# Patient Record
Sex: Female | Born: 1955 | Race: Black or African American | Hispanic: No | Smoking: Never smoker
Health system: Southern US, Community
[De-identification: ages and names within clinical notes are randomized; demographics above are authoritative.]

## PROBLEM LIST (undated history)

## (undated) DIAGNOSIS — F419 Anxiety disorder, unspecified: Secondary | ICD-10-CM

## (undated) DIAGNOSIS — E7800 Pure hypercholesterolemia, unspecified: Secondary | ICD-10-CM

## (undated) DIAGNOSIS — I10 Essential (primary) hypertension: Secondary | ICD-10-CM

## (undated) DIAGNOSIS — K219 Gastro-esophageal reflux disease without esophagitis: Secondary | ICD-10-CM

## (undated) HISTORY — DX: Anxiety disorder, unspecified: F41.9

## (undated) HISTORY — DX: Pure hypercholesterolemia, unspecified: E78.00

## (undated) HISTORY — DX: Gastro-esophageal reflux disease without esophagitis: K21.9

## (undated) HISTORY — DX: Essential (primary) hypertension: I10

---

## 2007-04-09 DIAGNOSIS — K589 Irritable bowel syndrome without diarrhea: Secondary | ICD-10-CM | POA: Insufficient documentation

## 2008-11-18 DIAGNOSIS — F411 Generalized anxiety disorder: Secondary | ICD-10-CM | POA: Insufficient documentation

## 2014-10-16 DIAGNOSIS — M25512 Pain in left shoulder: Secondary | ICD-10-CM | POA: Insufficient documentation

## 2015-12-22 LAB — HM COLONOSCOPY

## 2015-12-23 DIAGNOSIS — Z86010 Personal history of colonic polyps: Secondary | ICD-10-CM | POA: Insufficient documentation

## 2017-06-15 ENCOUNTER — Encounter: Payer: Self-pay | Admitting: Osteopathic Medicine

## 2017-06-15 ENCOUNTER — Ambulatory Visit (INDEPENDENT_AMBULATORY_CARE_PROVIDER_SITE_OTHER): Payer: BC Managed Care – PPO | Admitting: Osteopathic Medicine

## 2017-06-15 VITALS — BP 125/75 | HR 69 | Temp 98.5°F | Ht 65.0 in | Wt 172.8 lb

## 2017-06-15 DIAGNOSIS — K219 Gastro-esophageal reflux disease without esophagitis: Secondary | ICD-10-CM

## 2017-06-15 DIAGNOSIS — A6000 Herpesviral infection of urogenital system, unspecified: Secondary | ICD-10-CM | POA: Insufficient documentation

## 2017-06-15 DIAGNOSIS — Z114 Encounter for screening for human immunodeficiency virus [HIV]: Secondary | ICD-10-CM

## 2017-06-15 DIAGNOSIS — R350 Frequency of micturition: Secondary | ICD-10-CM

## 2017-06-15 DIAGNOSIS — E782 Mixed hyperlipidemia: Secondary | ICD-10-CM | POA: Diagnosis not present

## 2017-06-15 DIAGNOSIS — R7301 Impaired fasting glucose: Secondary | ICD-10-CM

## 2017-06-15 DIAGNOSIS — I10 Essential (primary) hypertension: Secondary | ICD-10-CM | POA: Diagnosis not present

## 2017-06-15 DIAGNOSIS — Z1159 Encounter for screening for other viral diseases: Secondary | ICD-10-CM | POA: Diagnosis not present

## 2017-06-15 DIAGNOSIS — F329 Major depressive disorder, single episode, unspecified: Secondary | ICD-10-CM | POA: Diagnosis not present

## 2017-06-15 DIAGNOSIS — F32A Depression, unspecified: Secondary | ICD-10-CM | POA: Insufficient documentation

## 2017-06-15 DIAGNOSIS — F419 Anxiety disorder, unspecified: Secondary | ICD-10-CM

## 2017-06-15 NOTE — Patient Instructions (Addendum)
   Thanks for coming to see us today  Labs today  Any concerns with anxiety, please come see me  Will request Pap/Mammogram records   When due for refills, please allow some extra time since prescriber will be switching to me Anything else you need just let us know!

## 2017-06-15 NOTE — Progress Notes (Signed)
HPI: Kathy Gutierrez is a 62 y.o. female who  has a past medical history of High blood pressure and High cholesterol.  she presents to [facility] today, 06/15/17,  for chief complaint of: Establish Care  Urinary Frequency See individual headings below   GERD: Prilosec about three times per week. Seeing GI - Dr Gwenevere Abbot. No significant symptoms lately.  HTN: Well-controlled on Bisoprolol/HCT , no chest pain, pressure, shortness of breath. No home blood pressures to report.  HLD: Well-controlled on Atorvastatin. Lipids last checked about a year ago  Anx/Dep: Wellbutrin 300 mg daily. She reports some increased anxiety, mostly due to work issues. Does not feel the need for additional medications or change in medication at this time.  Urinary frequency: Ongoing for some time. No pain/burning. Last few days a bit of discomfort with urination, no severe dysuria.   CKD: Slightly elevated creatinine/reduced GFR on previous labs done about a year ago. Stable.    Past medical, surgical, social and family history reviewed:  Patient Active Problem List   Diagnosis Date Noted  . Essential hypertension 06/15/2017  . Gastroesophageal reflux disease 06/15/2017  . Mixed hyperlipidemia 06/15/2017  . Anxiety and depression 06/15/2017  . Urinary frequency 06/15/2017  . Genital herpes simplex 06/15/2017   History reviewed. No pertinent surgical history.  Social History   Tobacco Use  . Smoking status: Never Smoker  . Smokeless tobacco: Never Used  Substance Use Topics  . Alcohol use: Not Currently    Family History  Problem Relation Age of Onset  . Diabetes Mother   . High Cholesterol Mother   . High blood pressure Mother   . Aortic aneurysm Mother   . High blood pressure Father   . Anuerysm Sister   . Multiple sclerosis Sister      Current medication list and allergy/intolerance information reviewed:    Current Outpatient Medications   Medication Sig Dispense Refill  . atorvastatin (LIPITOR) 20 MG tablet TK 1 T PO QD  3  . bisoprolol-hydrochlorothiazide (ZIAC) 5-6.25 MG tablet TK 1 T PO D  1  . buPROPion (WELLBUTRIN XL) 300 MG 24 hr tablet TK 1 T PO QAM  3  . Multiple Vitamins-Calcium (ONE-A-DAY WOMENS PO) Take by mouth.    Marland Kitchen omeprazole (PRILOSEC) 40 MG capsule Take 40 mg by mouth daily.    . valACYclovir (VALTREX) 500 MG tablet TK 1 T PO PRN  5   No current facility-administered medications for this visit.    No Known Allergies    Review of Systems:  Constitutional:  No  fever, no chills, No recent illness, No unintentional weight changes. No significant fatigue.   HEENT: No  headache, no vision change, no hearing change, No sore throat, No  sinus pressure  Cardiac: No  chest pain, No  pressure, No palpitations, No  Orthopnea  Respiratory:  No  shortness of breath. No  Cough  Gastrointestinal: No  abdominal pain, No  nausea, No  vomiting,  No  blood in stool, No  diarrhea, No  constipation   Musculoskeletal: No new myalgia/arthralgia  Skin: No  Rash, No other wounds/concerning lesions  Genitourinary: No  incontinence, No  abnormal genital bleeding, No abnormal genital discharge, +urinary frequency as per HPI  Hem/Onc: No  easy bruising/bleeding, No  abnormal lymph node  Endocrine: No cold intolerance,  No heat intolerance. No polyuria/polydipsia/polyphagia   Neurologic: No  weakness, No  dizziness, No  slurred speech/focal weakness/facial droop  Psychiatric: No  concerns with depression, No  concerns with anxiety, No sleep problems, No mood problems  Exam:  BP 125/75 (BP Location: Left Arm, Patient Position: Sitting, Cuff Size: Normal)   Pulse 69   Temp 98.5 F (36.9 C) (Oral)   Ht 5\' 5"  (1.651 m)   Wt 172 lb 12.8 oz (78.4 kg)   BMI 28.76 kg/m   Constitutional: VS see above. General Appearance: alert, well-developed, well-nourished, NAD  Eyes: Normal lids and conjunctive, non-icteric  sclera  Ears, Nose, Mouth, Throat: MMM, Normal external inspection ears/nares/mouth/lips/gums. TM normal bilaterally. Pharynx/tonsils no erythema, no exudate. Nasal mucosa normal.   Neck: No masses, trachea midline. No thyroid enlargement. No tenderness/mass appreciated. No lymphadenopathy  Respiratory: Normal respiratory effort. no wheeze, no rhonchi, no rales  Cardiovascular: S1/S2 normal, no murmur, no rub/gallop auscultated. RRR. No lower extremity edema.   Gastrointestinal: Nontender, no masses. No hepatomegaly, no splenomegaly. No hernia appreciated. Bowel sounds normal. Rectal exam deferred.   Musculoskeletal: Gait normal. No clubbing/cyanosis of digits.   Neurological: Normal balance/coordination. No tremor. No cranial nerve deficit on limited exam. Motor and sensation intact and symmetric. Cerebellar reflexes intact.   Skin: warm, dry, intact. No rash/ulcer. No concerning nevi or subq nodules on limited exam.    Psychiatric: Normal judgment/insight. Normal mood and affect. Oriented x3.      ASSESSMENT/PLAN:   Essential hypertension - Plan: CBC, COMPLETE METABOLIC PANEL WITH GFR, Lipid panel  Gastroesophageal reflux disease, esophagitis presence not specified  Mixed hyperlipidemia - Plan: Lipid panel  Anxiety and depression  Urinary frequency - Plan: Urinalysis, Routine w reflex microscopic  Need for hepatitis C screening test - Plan: Hepatitis C antibody  Encounter for screening for HIV - Plan: HIV antibody  Genital herpes simplex, unspecified site    Patient Instructions   Thanks for coming to see us today  Labs today  Any concerns with anxiety, please come see me  Will request Pap/Mammogram records   When due for refills, please allow some extra time since prescriber will be switching to me Anything else you need just let us know!     Visit summary with medication list and pertinent instructions was printed for patient to review. All questions at  time of visit were answered - patient instructed to contact office with any additional concerns. ER/RTC precautions were reviewed with the patient.   Follow-up plan: Return in about 6 months (around 12/15/2017) for monitor BP, can do PAP/Annual at that visit. See us sooner if needed .    Please note: voice recognition software was used to produce this document, and typos may escape review. Please contact Dr. Lyn HollingsheadAlexander for any needed clarifications.

## 2017-06-16 LAB — COMPLETE METABOLIC PANEL WITH GFR
AG RATIO: 1.6 (calc) (ref 1.0–2.5)
ALT: 13 U/L (ref 6–29)
AST: 17 U/L (ref 10–35)
Albumin: 4.5 g/dL (ref 3.6–5.1)
Alkaline phosphatase (APISO): 79 U/L (ref 33–130)
BILIRUBIN TOTAL: 0.4 mg/dL (ref 0.2–1.2)
BUN / CREAT RATIO: 10 (calc) (ref 6–22)
BUN: 12 mg/dL (ref 7–25)
CHLORIDE: 103 mmol/L (ref 98–110)
CO2: 31 mmol/L (ref 20–32)
Calcium: 9.9 mg/dL (ref 8.6–10.4)
Creat: 1.18 mg/dL — ABNORMAL HIGH (ref 0.50–0.99)
GFR, Est African American: 58 mL/min/{1.73_m2} — ABNORMAL LOW (ref >=60)
GFR, Est Non African American: 50 mL/min/{1.73_m2} — ABNORMAL LOW (ref >=60)
GLOBULIN: 2.9 g/dL (ref 1.9–3.7)
Glucose, Bld: 112 mg/dL — ABNORMAL HIGH (ref 65–99)
POTASSIUM: 3.7 mmol/L (ref 3.5–5.3)
Sodium: 141 mmol/L (ref 135–146)
TOTAL PROTEIN: 7.4 g/dL (ref 6.1–8.1)

## 2017-06-16 LAB — URINALYSIS, ROUTINE W REFLEX MICROSCOPIC
BACTERIA UA: NONE SEEN /HPF
BILIRUBIN URINE: NEGATIVE
Glucose, UA: NEGATIVE
Hgb urine dipstick: NEGATIVE
Hyaline Cast: NONE SEEN /LPF
Ketones, ur: NEGATIVE
NITRITE: NEGATIVE
PROTEIN: NEGATIVE
RBC / HPF: NONE SEEN /HPF (ref 0–2)
SQUAMOUS EPITHELIAL / LPF: NONE SEEN /HPF (ref <=5)
Specific Gravity, Urine: 1.011 (ref 1.001–1.03)
pH: 7.5 (ref 5.0–8.0)

## 2017-06-16 LAB — CBC
HEMATOCRIT: 37 % (ref 35.0–45.0)
HEMOGLOBIN: 12.6 g/dL (ref 11.7–15.5)
MCH: 30.4 pg (ref 27.0–33.0)
MCHC: 34.1 g/dL (ref 32.0–36.0)
MCV: 89.2 fL (ref 80.0–100.0)
MPV: 10.7 fL (ref 7.5–12.5)
Platelets: 285 10*3/uL (ref 140–400)
RBC: 4.15 10*6/uL (ref 3.80–5.10)
RDW: 13.5 % (ref 11.0–15.0)
WBC: 6.1 10*3/uL (ref 3.8–10.8)

## 2017-06-16 LAB — HEPATITIS C ANTIBODY
HEP C AB: NONREACTIVE
SIGNAL TO CUT-OFF: 0.01 (ref <1.00)

## 2017-06-16 LAB — LIPID PANEL
Cholesterol: 180 mg/dL (ref <200)
HDL: 45 mg/dL — ABNORMAL LOW (ref >50)
LDL CHOLESTEROL (CALC): 105 mg/dL — AB
Non-HDL Cholesterol (Calc): 135 mg/dL (calc) — ABNORMAL HIGH (ref <130)
Total CHOL/HDL Ratio: 4 (calc) (ref <5.0)
Triglycerides: 183 mg/dL — ABNORMAL HIGH (ref <150)

## 2017-06-16 LAB — HIV ANTIBODY (ROUTINE TESTING W REFLEX): HIV: NONREACTIVE

## 2017-06-20 ENCOUNTER — Encounter: Payer: Self-pay | Admitting: Osteopathic Medicine

## 2017-06-20 NOTE — Addendum Note (Signed)
Addended by: Deirdre PippinsALEXANDER, Amarra Sawyer M on: 06/20/2017 02:45 PM   Modules accepted: Orders

## 2017-06-23 LAB — HEMOGLOBIN A1C
EAG (MMOL/L): 7.1 (calc)
HEMOGLOBIN A1C: 6.1 %{Hb} — AB (ref <5.7)
Mean Plasma Glucose: 128 (calc)

## 2017-08-01 ENCOUNTER — Other Ambulatory Visit: Payer: Self-pay | Admitting: Osteopathic Medicine

## 2017-08-01 DIAGNOSIS — I10 Essential (primary) hypertension: Secondary | ICD-10-CM

## 2017-08-02 ENCOUNTER — Encounter: Payer: Self-pay | Admitting: Osteopathic Medicine

## 2017-08-02 NOTE — Telephone Encounter (Signed)
[facility] pharmacy requesting med RF for bisoprolol-hctz. Rx written by historical provider.

## 2017-08-02 NOTE — Telephone Encounter (Signed)
Left a detailed vm msg for pt. Call back information provided.

## 2017-09-26 ENCOUNTER — Other Ambulatory Visit: Payer: Self-pay | Admitting: Osteopathic Medicine

## 2017-09-26 DIAGNOSIS — I10 Essential (primary) hypertension: Secondary | ICD-10-CM

## 2017-11-07 ENCOUNTER — Ambulatory Visit (INDEPENDENT_AMBULATORY_CARE_PROVIDER_SITE_OTHER): Payer: BC Managed Care – PPO | Admitting: Sports Medicine

## 2017-11-07 ENCOUNTER — Ambulatory Visit (INDEPENDENT_AMBULATORY_CARE_PROVIDER_SITE_OTHER): Payer: BC Managed Care – PPO

## 2017-11-07 ENCOUNTER — Encounter: Payer: Self-pay | Admitting: Sports Medicine

## 2017-11-07 DIAGNOSIS — J069 Acute upper respiratory infection, unspecified: Secondary | ICD-10-CM

## 2017-11-07 DIAGNOSIS — R05 Cough: Secondary | ICD-10-CM | POA: Diagnosis not present

## 2017-11-07 DIAGNOSIS — R059 Cough, unspecified: Secondary | ICD-10-CM | POA: Insufficient documentation

## 2017-11-07 MED ORDER — BENZONATATE 200 MG PO CAPS: 200.0000 mg | ORAL_CAPSULE | Freq: Three times a day (TID) | ORAL | 0 refills | Status: DC | PRN

## 2017-11-07 MED ORDER — FLUTICASONE PROPIONATE 50 MCG/ACT NA SUSP: NASAL | 3 refills | Status: DC

## 2017-11-07 MED ORDER — AZITHROMYCIN 250 MG PO TABS: ORAL_TABLET | ORAL | 0 refills | Status: DC

## 2017-11-07 NOTE — Progress Notes (Signed)
Subjective:    CC: Coughing  HPI: This is a pleasant 62 year old female, for the past couple of days she has had runny nose, sore throat with a mild cough.  No fevers or chills, no GI symptoms, no skin rash.  Symptoms are mild, persistent.  No overt facial pain or pressure.  I reviewed the past medical history, family history, social history, surgical history, and allergies today and no changes were needed.  Please see the problem list section below in epic for further details.  Past Medical History: Past Medical History:  Diagnosis Date  . High blood pressure   . High cholesterol    Past Surgical History: No past surgical history on file. Social History: Social History   Socioeconomic History  . Marital status: Divorced    Spouse name: Not on file  . Number of children: 1  . Years of education: Not on file  . Highest education level: Master's degree (e.g., MA, MS, MEng, MEd, MSW, MBA)  Occupational History  . Occupation: Garment/textile technologist: [employer]  Social Needs  . Financial resource strain: Not on file  . Food insecurity:    Worry: Not on file    Inability: Not on file  . Transportation needs:    Medical: Not on file    Non-medical: Not on file  Tobacco Use  . Smoking status: Never Smoker  . Smokeless tobacco: Never Used  Substance and Sexual Activity  . Alcohol use: Not Currently  . Drug use: Never  . Sexual activity: Yes    Birth control/protection: None  Lifestyle  . Physical activity:    Days per week: Not on file    Minutes per session: Not on file  . Stress: Not on file  Relationships  . Social connections:    Talks on phone: Not on file    Gets together: Not on file    Attends religious service: Not on file    Active member of club or organization: Not on file    Attends meetings of clubs or organizations: Not on file    Relationship status: Not on file  Other Topics Concern  . Not on file  Social History Narrative  . Not on file    Family History: Family History  Problem Relation Age of Onset  . Diabetes Mother   . High Cholesterol Mother   . High blood pressure Mother   . Aortic aneurysm Mother   . High blood pressure Father   . Anuerysm Sister   . Multiple sclerosis Sister    Allergies: No Known Allergies Medications: See med rec.  Review of Systems: No fevers, chills, night sweats, weight loss, chest pain, or shortness of breath.   Objective:    General: Well Developed, well nourished, and in no acute distress.  Neuro: Alert and oriented x3, extra-ocular muscles intact, sensation grossly intact.  HEENT: Normocephalic, atraumatic, pupils equal round reactive to light, neck supple, no masses, no lymphadenopathy, thyroid nonpalpable.  Oropharynx, nasopharynx, ear canals unremarkable. Skin: Warm and dry, no rashes. Cardiac: Regular rate and rhythm, no murmurs rubs or gallops, no lower extremity edema.  Respiratory: Coarse breath sounds in the right upper lobe. Not using accessory muscles, speaking in full sentences.  Chest x-ray reviewed, there is a vague nodular opacity in the right upper lobe.  Impression and Recommendations:    Coughing Some symptoms of lower respiratory tract involvement with right upper lobe coarse breath sounds, coughing. Principal symptom is rhinorrhea with postnasal drip.  Adding Flonase, benzonatate, chest x-ray. Return if no better in a week or 2.  Vague right upper lobe nodular opacity, combined with coarse breath sounds in the right upper lobe we are going to treat this as a pneumonia. Adding azithromycin, return to see [place] in a month for repeat chest x-ray.  ___________________________________________ Ihor Austin. Benjamin Stain, M.D., ABFM., CAQSM. Primary Care and Sports Medicine [facility]  Adjunct Instructor of Family Medicine  [employer]

## 2017-11-07 NOTE — Assessment & Plan Note (Addendum)
Some symptoms of lower respiratory tract involvement with right upper lobe coarse breath sounds, coughing. Principal symptom is rhinorrhea with postnasal drip. Adding Flonase, benzonatate, chest x-ray. Return if no better in a week or 2.  Vague right upper lobe nodular opacity, combined with coarse breath sounds in the right upper lobe we are going to treat this as a pneumonia. Adding azithromycin, return to see [place] in a month for repeat chest x-ray.

## 2017-11-14 ENCOUNTER — Ambulatory Visit: Payer: BC Managed Care – PPO | Admitting: Sports Medicine

## 2017-11-15 ENCOUNTER — Telehealth: Payer: Self-pay | Admitting: Sports Medicine

## 2017-11-15 DIAGNOSIS — R918 Other nonspecific abnormal finding of lung field: Secondary | ICD-10-CM

## 2017-11-15 DIAGNOSIS — R05 Cough: Secondary | ICD-10-CM

## 2017-11-15 DIAGNOSIS — R059 Cough, unspecified: Secondary | ICD-10-CM

## 2017-11-15 NOTE — Telephone Encounter (Signed)
PT requested chest xray before 11/24/17 appointment.  Please advise and contact.

## 2017-11-15 NOTE — Telephone Encounter (Signed)
LMOM notifying pt to get cxr the day before her appointment.

## 2017-11-22 ENCOUNTER — Ambulatory Visit (INDEPENDENT_AMBULATORY_CARE_PROVIDER_SITE_OTHER): Payer: BC Managed Care – PPO

## 2017-11-22 DIAGNOSIS — R918 Other nonspecific abnormal finding of lung field: Secondary | ICD-10-CM | POA: Diagnosis not present

## 2017-11-24 ENCOUNTER — Ambulatory Visit (INDEPENDENT_AMBULATORY_CARE_PROVIDER_SITE_OTHER): Payer: BC Managed Care – PPO | Admitting: Sports Medicine

## 2017-11-24 DIAGNOSIS — R059 Cough, unspecified: Secondary | ICD-10-CM

## 2017-11-24 DIAGNOSIS — R05 Cough: Secondary | ICD-10-CM

## 2017-11-24 NOTE — Progress Notes (Signed)
  Subjective:    CC: Follow-up  HPI: Kathy Gutierrez returns, she had a mild pneumonia, treated with azithromycin, symptoms resolved, repeat chest x-ray clear.  I reviewed the past medical history, family history, social history, surgical history, and allergies today and no changes were needed.  Please see the problem list section below in epic for further details.  Past Medical History: Past Medical History:  Diagnosis Date  . High blood pressure   . High cholesterol    Past Surgical History: No past surgical history on file. Social History: Social History   Socioeconomic History  . Marital status: Divorced    Spouse name: Not on file  . Number of children: 1  . Years of education: Not on file  . Highest education level: Master's degree (e.g., MA, MS, MEng, MEd, MSW, MBA)  Occupational History  . Occupation: Garment/textile technologist: [employer]  Social Needs  . Financial resource strain: Not on file  . Food insecurity:    Worry: Not on file    Inability: Not on file  . Transportation needs:    Medical: Not on file    Non-medical: Not on file  Tobacco Use  . Smoking status: Never Smoker  . Smokeless tobacco: Never Used  Substance and Sexual Activity  . Alcohol use: Not Currently  . Drug use: Never  . Sexual activity: Yes    Birth control/protection: None  Lifestyle  . Physical activity:    Days per week: Not on file    Minutes per session: Not on file  . Stress: Not on file  Relationships  . Social connections:    Talks on phone: Not on file    Gets together: Not on file    Attends religious service: Not on file    Active member of club or organization: Not on file    Attends meetings of clubs or organizations: Not on file    Relationship status: Not on file  Other Topics Concern  . Not on file  Social History Narrative  . Not on file   Family History: Family History  Problem Relation Age of Onset  . Diabetes Mother   . High Cholesterol Mother   . High blood  pressure Mother   . Aortic aneurysm Mother   . High blood pressure Father   . Anuerysm Sister   . Multiple sclerosis Sister    Allergies: No Known Allergies Medications: See med rec.  Review of Systems: No fevers, chills, night sweats, weight loss, chest pain, or shortness of breath.   Objective:    General: Well Developed, well nourished, and in no acute distress.  Neuro: Alert and oriented x3, extra-ocular muscles intact, sensation grossly intact.  HEENT: Normocephalic, atraumatic, pupils equal round reactive to light, neck supple, no masses, no lymphadenopathy, thyroid nonpalpable.  Skin: Warm and dry, no rashes. Cardiac: Regular rate and rhythm, no murmurs rubs or gallops, no lower extremity edema.  Respiratory: Clear to auscultation bilaterally. Not using accessory muscles, speaking in full sentences.  Impression and Recommendations:    Coughing Likely mild pneumonia. Repeat chest x-ray was clear although she did come back a little bit too early for it. Symptoms have resolved, return as needed. ___________________________________________ Ihor Austin. Benjamin Stain, M.D., ABFM., CAQSM. Primary Care and Sports Medicine [facility]  Adjunct Instructor of Family Medicine  [employer]

## 2017-11-24 NOTE — Assessment & Plan Note (Signed)
Likely mild pneumonia. Repeat chest x-ray was clear although she did come back a little bit too early for it. Symptoms have resolved, return as needed.

## 2017-12-14 ENCOUNTER — Ambulatory Visit: Payer: BC Managed Care – PPO | Admitting: Osteopathic Medicine

## 2018-01-02 ENCOUNTER — Ambulatory Visit: Payer: BC Managed Care – PPO | Admitting: Osteopathic Medicine

## 2018-01-02 ENCOUNTER — Encounter: Payer: Self-pay | Admitting: Osteopathic Medicine

## 2018-01-02 VITALS — BP 128/75 | HR 62 | Temp 98.3°F | Wt 170.7 lb

## 2018-01-02 DIAGNOSIS — E782 Mixed hyperlipidemia: Secondary | ICD-10-CM | POA: Diagnosis not present

## 2018-01-02 DIAGNOSIS — K219 Gastro-esophageal reflux disease without esophagitis: Secondary | ICD-10-CM | POA: Diagnosis not present

## 2018-01-02 DIAGNOSIS — I10 Essential (primary) hypertension: Secondary | ICD-10-CM

## 2018-01-02 DIAGNOSIS — R7301 Impaired fasting glucose: Secondary | ICD-10-CM | POA: Diagnosis not present

## 2018-01-02 LAB — POCT GLYCOSYLATED HEMOGLOBIN (HGB A1C): Hemoglobin A1C: 5.9 % — AB (ref 4.0–5.6)

## 2018-01-02 MED ORDER — ATORVASTATIN CALCIUM 20 MG PO TABS: 20.0000 mg | ORAL_TABLET | Freq: Every day | ORAL | 3 refills | Status: DC

## 2018-01-02 MED ORDER — OMEPRAZOLE 40 MG PO CPDR: 40.0000 mg | DELAYED_RELEASE_CAPSULE | Freq: Every day | ORAL | 3 refills | Status: DC

## 2018-01-02 NOTE — Patient Instructions (Signed)
Blood pressure:  We will leave medications as is for now  Top number goal 130 or less  Bottom number goal 80 or less  Keep a record of blood pressures over the next 2 weeks  Call or message [place] to let us know what the numbers are  Prediabetes:  Consider metformin medication if A1c goes up  Diet: Avoid carbohydrates/sugars, increase fiber  Exercise: Increase as tolerated

## 2018-01-02 NOTE — Progress Notes (Signed)
HPI: Kathy Gutierrez is a 62 y.o. female who  has a past medical history of High blood pressure and High cholesterol.  she presents to [facility] today, 01/02/18,  for chief complaint of:  Sugars follow-up BP follow-up  Prediabetic range sugars, overdue for follow-up.  A1C record: 06/23/17: 6.1% 01/02/18 today: 5.9%  BP recheck: elevated BP on intake to 144/72, no CP/SOB, no HA/VC, no dizziness. Recheck at 150/100. Taking bisoprolol-HCT 5-6.25.  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   BP Readings from Last 3 Encounters:  01/02/18 (!) 144/72  11/24/17 120/70  11/07/17 (!) 141/84      Past medical history, surgical history, and family history reviewed.  Current medication list and allergy/intolerance information reviewed.   (See remainder of HPI, ROS, Phys Exam below)  Results for orders placed or performed in visit on 01/02/18 (from the past 72 hour(s))  POCT HgB A1C     Status: Abnormal   Collection Time: 01/02/18  7:56 AM  Result Value Ref Range   Hemoglobin A1C 5.9 (A) 4.0 - 5.6 %   HbA1c POC (<> result, manual entry)     HbA1c, POC (prediabetic range)     HbA1c, POC (controlled diabetic range)          ASSESSMENT/PLAN:   Elevated fasting glucose - Plan: POCT HgB A1C  Essential hypertension  Gastroesophageal reflux disease, esophagitis presence not specified - Plan: omeprazole (PRILOSEC) 40 MG capsule  Mixed hyperlipidemia - Plan: atorvastatin (LIPITOR) 20 MG tablet   Meds ordered this encounter  Medications  . omeprazole (PRILOSEC) 40 MG capsule    Sig: Take 1 capsule (40 mg total) by mouth daily.    Dispense:  90 capsule    Refill:  3  . atorvastatin (LIPITOR) 20 MG tablet    Sig: Take 1 tablet (20 mg total) by mouth daily.    Dispense:  90 tablet    Refill:  3    Patient Instructions  Blood pressure:  We will leave  medications as is for now  Top number goal 130 or less  Bottom number goal 80 or less  Keep a record of blood pressures over the next 2 weeks  Call or message [place] to let us know what the numbers are  Prediabetes:  Consider metformin medication if A1c goes up  Diet: Avoid carbohydrates/sugars, increase fiber  Exercise: Increase as tolerated   Follow-up plan: Return in about 4 months (around 05/03/2018) for prediabetes - A1C recheck. Sooner if needed! .                 ############################################ ############################################ ############################################ ############################################    Outpatient Encounter Medications as of 01/02/2018  Medication Sig  . atorvastatin (LIPITOR) 20 MG tablet TK 1 T PO QD  . bisoprolol-hydrochlorothiazide (ZIAC) 5-6.25 MG tablet TAKE 1 TABLET BY MOUTH DAILY  . buPROPion (WELLBUTRIN XL) 300 MG 24 hr tablet TK 1 T PO QAM  . fluticasone (FLONASE) 50 MCG/ACT nasal spray One spray in each nostril twice a day, use left hand for right nostril, and right hand for left nostril.  . Multiple Vitamins-Calcium (ONE-A-DAY WOMENS PO) Take by mouth.  Marland Kitchen omeprazole (PRILOSEC) 40 MG capsule Take 40 mg by mouth daily.  . valACYclovir (VALTREX) 500 MG tablet TK 1 T PO PRN   No facility-administered encounter medications on file as of 01/02/2018.    No Known  Allergies    Review of Systems:  Constitutional: No recent illness  HEENT: No  headache, no vision change  Cardiac: No  chest pain, No  pressure, No palpitations  Respiratory:  No  shortness of breath. No  Cough  Gastrointestinal: No  abdominal pain, no change on bowel habits  Musculoskeletal: No new myalgia/arthralgia  Skin: No  Rash  Hem/Onc: No  easy bruising/bleeding, No  abnormal lumps/bumps  Neurologic: No  weakness, No  Dizziness  Psychiatric: No  concerns with depression, No  concerns with anxiety  Exam:  BP  (!) 144/72 (BP Location: Left Arm, Patient Position: Sitting, Cuff Size: Normal)   Pulse 67   Temp 98.3 F (36.8 C) (Oral)   Wt 170 lb 11.2 oz (77.4 kg)   BMI 28.41 kg/m   Constitutional: VS see above. General Appearance: alert, well-developed, well-nourished, NAD  Eyes: Normal lids and conjunctive, non-icteric sclera  Ears, Nose, Mouth, Throat: MMM, Normal external inspection ears/nares/mouth/lips/gums.  Neck: No masses, trachea midline.   Respiratory: Normal respiratory effort. no wheeze, no rhonchi, no rales  Cardiovascular: S1/S2 normal, no murmur, no rub/gallop auscultated. RRR.   Musculoskeletal: Gait normal. Symmetric and independent movement of all extremities  Neurological: Normal balance/coordination. No tremor.  Skin: warm, dry, intact.   Psychiatric: Normal judgment/insight. Normal mood and affect. Oriented x3.   Visit summary with medication list and pertinent instructions was printed for patient to review, advised to alert [place] if any changes needed. All questions at time of visit were answered - patient instructed to contact office with any additional concerns. ER/RTC precautions were reviewed with the patient and understanding verbalized.   Follow-up plan: Return in about 4 months (around 05/03/2018) for prediabetes - A1C recheck. Sooner if needed! .  Note: Total time spent 25 minutes, greater than 50% of the visit was spent face-to-face counseling and coordinating care for the following: The primary encounter diagnosis was Elevated fasting glucose. Diagnoses of Essential hypertension, Gastroesophageal reflux disease, esophagitis presence not specified, and Mixed hyperlipidemia were also pertinent to this visit.Marland Kitchen  Please note: voice recognition software was used to produce this document, and typos may escape review. Please contact Dr. Lyn Hollingshead for any needed clarifications.

## 2018-02-11 ENCOUNTER — Other Ambulatory Visit: Payer: Self-pay | Admitting: Osteopathic Medicine

## 2018-02-11 DIAGNOSIS — I10 Essential (primary) hypertension: Secondary | ICD-10-CM

## 2018-03-23 LAB — HM MAMMOGRAPHY

## 2018-04-19 ENCOUNTER — Encounter: Payer: Self-pay | Admitting: Osteopathic Medicine

## 2018-05-10 ENCOUNTER — Other Ambulatory Visit: Payer: Self-pay | Admitting: Osteopathic Medicine

## 2018-05-10 DIAGNOSIS — I10 Essential (primary) hypertension: Secondary | ICD-10-CM

## 2018-07-06 ENCOUNTER — Encounter: Payer: Self-pay | Admitting: Osteopathic Medicine

## 2018-07-06 DIAGNOSIS — A6000 Herpesviral infection of urogenital system, unspecified: Secondary | ICD-10-CM

## 2018-07-06 MED ORDER — VALACYCLOVIR HCL 500 MG PO TABS: 500.0000 mg | ORAL_TABLET | Freq: Every day | ORAL | 3 refills | Status: DC

## 2018-08-07 ENCOUNTER — Ambulatory Visit: Payer: BC Managed Care – PPO | Admitting: Osteopathic Medicine

## 2018-08-07 ENCOUNTER — Other Ambulatory Visit: Payer: Self-pay | Admitting: Osteopathic Medicine

## 2018-08-07 DIAGNOSIS — I10 Essential (primary) hypertension: Secondary | ICD-10-CM

## 2018-08-07 NOTE — Telephone Encounter (Signed)
Patient has appt tomorrow, to address refill at that time.

## 2018-08-07 NOTE — Telephone Encounter (Signed)
Please advise 

## 2018-08-08 ENCOUNTER — Encounter: Payer: Self-pay | Admitting: Osteopathic Medicine

## 2018-08-08 ENCOUNTER — Ambulatory Visit (INDEPENDENT_AMBULATORY_CARE_PROVIDER_SITE_OTHER): Payer: BC Managed Care – PPO | Admitting: Osteopathic Medicine

## 2018-08-08 ENCOUNTER — Other Ambulatory Visit: Payer: Self-pay | Admitting: Osteopathic Medicine

## 2018-08-08 VITALS — BP 135/79 | HR 57 | Temp 97.7°F | Wt 167.3 lb

## 2018-08-08 DIAGNOSIS — R7303 Prediabetes: Secondary | ICD-10-CM

## 2018-08-08 DIAGNOSIS — I10 Essential (primary) hypertension: Secondary | ICD-10-CM

## 2018-08-08 DIAGNOSIS — R5382 Chronic fatigue, unspecified: Secondary | ICD-10-CM

## 2018-08-08 LAB — POCT GLYCOSYLATED HEMOGLOBIN (HGB A1C): Hemoglobin A1C: 5.9 % — AB (ref 4.0–5.6)

## 2018-08-08 MED ORDER — BISOPROLOL-HYDROCHLOROTHIAZIDE 5-6.25 MG PO TABS: 1.0000 | ORAL_TABLET | Freq: Every day | ORAL | 1 refills | Status: DC

## 2018-08-08 MED ORDER — METFORMIN HCL ER 750 MG PO TB24: 750.0000 mg | ORAL_TABLET | Freq: Every day | ORAL | 1 refills | Status: DC

## 2018-08-08 NOTE — Progress Notes (Signed)
HPI: Kathy Gutierrez is a 63 y.o. female who  has a past medical history of High blood pressure and High cholesterol.  [facility], 08/08/18,  for chief complaint of: Prediabetes follow-up   A1C Prediabetic range sugars, overdue for follow-up.  A1C record: 06/23/17: 6.1% 01/02/18: 5.9% Today 08/08/18: 5.9% Pt open to starting metformin     BP Readings from Last 3 Encounters:  08/08/18 135/79  01/02/18 128/75  11/24/17 120/70       Past medical, surgical, social and family history reviewed:  Patient Active Problem List   Diagnosis Date Noted  . Coughing 11/07/2017  . Essential hypertension 06/15/2017  . Gastroesophageal reflux disease 06/15/2017  . Mixed hyperlipidemia 06/15/2017  . Anxiety and depression 06/15/2017  . Urinary frequency 06/15/2017  . Genital herpes simplex 06/15/2017    No past surgical history on file.  Social History   Tobacco Use  . Smoking status: Never Smoker  . Smokeless tobacco: Never Used  Substance Use Topics  . Alcohol use: Not Currently    Family History  Problem Relation Age of Onset  . Diabetes Mother   . High Cholesterol Mother   . High blood pressure Mother   . Aortic aneurysm Mother   . High blood pressure Father   . Anuerysm Sister   . Multiple sclerosis Sister      Current medication list and allergy/intolerance information reviewed:    Current Outpatient Medications  Medication Sig Dispense Refill  . atorvastatin (LIPITOR) 20 MG tablet Take 1 tablet (20 mg total) by mouth daily. 90 tablet 3  . bisoprolol-hydrochlorothiazide (ZIAC) 5-6.25 MG tablet TAKE 1 TABLET BY MOUTH DAILY 90 tablet 0  . buPROPion (WELLBUTRIN XL) 300 MG 24 hr tablet TK 1 T PO QAM  3  . fluticasone (FLONASE) 50 MCG/ACT nasal spray One spray in each nostril twice a day, use left hand for right nostril, and right hand for left nostril. 48 g 3  . Multiple Vitamins-Calcium (ONE-A-DAY WOMENS PO)  Take by mouth.    Marland Kitchen. omeprazole (PRILOSEC) 40 MG capsule Take 1 capsule (40 mg total) by mouth daily. 90 capsule 3  . valACYclovir (VALTREX) 500 MG tablet Take 1 tablet (500 mg total) by mouth daily. 90 tablet 3  . metFORMIN (GLUCOPHAGE XR) 750 MG 24 hr tablet Take 1 tablet (750 mg total) by mouth daily with breakfast. 90 tablet 1   No current facility-administered medications for this visit.     No Known Allergies    Review of Systems:  Constitutional:  No  fever, no chills, No recent illness, +fatigue   Cardiac: No  chest pain  Respiratory:  No  shortness of breath.   Neurologic: No  weakness, No  dizziness  Psychiatric: No  concerns with depression, No  concerns with anxiety  Exam:  BP 135/79 (BP Location: Left Arm, Patient Position: Sitting, Cuff Size: Normal)   Pulse (!) 57   Temp 97.7 F (36.5 C) (Oral)   Wt 167 lb 4.8 oz (75.9 kg)   BMI 27.84 kg/m   Constitutional: VS see above. General Appearance: alert, well-developed, well-nourished, NAD  Eyes: Normal lids and conjunctive, non-icteric sclera  Neck: No masses, trachea midline.   Respiratory: Normal respiratory effort.  Musculoskeletal: Gait normal.    Neurological: Normal balance/coordination. No tremor.   Skin: warm, dry, intact.  Psychiatric: Normal judgment/insight. Normal mood and affect. Oriented x3.    ASSESSMENT/PLAN: The primary encounter diagnosis was Prediabetes. A diagnosis  of Chronic fatigue was also pertinent to this visit.   Trial metformin for prevention of progression of disease, follow-up A1C    Orders Placed This Encounter  Procedures  . CBC  . COMPLETE METABOLIC PANEL WITH GFR  . Lipid panel  . TSH  . VITAMIN D 25 Hydroxy (Vit-D Deficiency, Fractures)    Meds ordered this encounter  Medications  . metFORMIN (GLUCOPHAGE XR) 750 MG 24 hr tablet    Sig: Take 1 tablet (750 mg total) by mouth daily with breakfast.    Dispense:  90 tablet    Refill:  1         Visit  summary with medication list and pertinent instructions was printed for patient to review. All questions at time of visit were answered - patient instructed to contact office with any additional concerns or updates. ER/RTC precautions were reviewed with the patient.   Note: Total time spent 25 minutes, greater than 50% of the visit was spent face-to-face counseling and coordinating care for the above diagnoses listed in assessment/plan.   Please note: voice recognition software was used to produce this document, and typos may escape review. Please contact Dr. Sheppard Coil for any needed clarifications.     Follow-up plan: Return in about 3 months (around 11/08/2018) for recheck A1C / annual physical - see me sooner if needed! Marland Kitchen

## 2018-08-08 NOTE — Addendum Note (Signed)
Addended by: Mertha Finders on: 08/08/2018 09:38 AM   Modules accepted: Orders

## 2018-08-09 LAB — COMPLETE METABOLIC PANEL WITH GFR
AG Ratio: 1.6 (calc) (ref 1.0–2.5)
ALT: 11 U/L (ref 6–29)
AST: 14 U/L (ref 10–35)
Albumin: 4.3 g/dL (ref 3.6–5.1)
Alkaline phosphatase (APISO): 73 U/L (ref 37–153)
BUN/Creatinine Ratio: 9 (calc) (ref 6–22)
BUN: 11 mg/dL (ref 7–25)
CO2: 32 mmol/L (ref 20–32)
Calcium: 9.9 mg/dL (ref 8.6–10.4)
Chloride: 106 mmol/L (ref 98–110)
Creat: 1.18 mg/dL — ABNORMAL HIGH (ref 0.50–0.99)
GFR, Est African American: 58 mL/min/{1.73_m2} — ABNORMAL LOW (ref >=60)
GFR, Est Non African American: 50 mL/min/{1.73_m2} — ABNORMAL LOW (ref >=60)
Globulin: 2.7 g/dL (calc) (ref 1.9–3.7)
Glucose, Bld: 120 mg/dL — ABNORMAL HIGH (ref 65–99)
Potassium: 4.4 mmol/L (ref 3.5–5.3)
Sodium: 144 mmol/L (ref 135–146)
Total Bilirubin: 0.4 mg/dL (ref 0.2–1.2)
Total Protein: 7 g/dL (ref 6.1–8.1)

## 2018-08-09 LAB — LIPID PANEL
Cholesterol: 178 mg/dL (ref <200)
HDL: 46 mg/dL — ABNORMAL LOW (ref >=50)
LDL Cholesterol (Calc): 105 mg/dL (calc) — ABNORMAL HIGH
Non-HDL Cholesterol (Calc): 132 mg/dL (calc) — ABNORMAL HIGH (ref <130)
Total CHOL/HDL Ratio: 3.9 (calc) (ref <5.0)
Triglycerides: 154 mg/dL — ABNORMAL HIGH (ref <150)

## 2018-08-09 LAB — CBC
HCT: 35.7 % (ref 35.0–45.0)
Hemoglobin: 11.9 g/dL (ref 11.7–15.5)
MCH: 30.6 pg (ref 27.0–33.0)
MCHC: 33.3 g/dL (ref 32.0–36.0)
MCV: 91.8 fL (ref 80.0–100.0)
MPV: 11.1 fL (ref 7.5–12.5)
Platelets: 254 10*3/uL (ref 140–400)
RBC: 3.89 10*6/uL (ref 3.80–5.10)
RDW: 13.8 % (ref 11.0–15.0)
WBC: 5.4 10*3/uL (ref 3.8–10.8)

## 2018-08-09 LAB — TSH: TSH: 1.46 mIU/L (ref 0.40–4.50)

## 2018-08-09 LAB — VITAMIN D 25 HYDROXY (VIT D DEFICIENCY, FRACTURES): Vit D, 25-Hydroxy: 34 ng/mL (ref 30–100)

## 2018-08-13 ENCOUNTER — Encounter: Payer: Self-pay | Admitting: Osteopathic Medicine

## 2018-09-03 DIAGNOSIS — M25551 Pain in right hip: Secondary | ICD-10-CM | POA: Insufficient documentation

## 2018-09-03 DIAGNOSIS — Z789 Other specified health status: Secondary | ICD-10-CM | POA: Insufficient documentation

## 2018-11-07 ENCOUNTER — Encounter: Payer: BC Managed Care – PPO | Admitting: Osteopathic Medicine

## 2018-11-08 ENCOUNTER — Encounter: Payer: Self-pay | Admitting: Osteopathic Medicine

## 2018-11-08 NOTE — Telephone Encounter (Signed)
Looks like colonoscopy was done, can see in care everywhere, but no report available.  Can we contact patient to see where she went so that we can get the official report on this

## 2018-11-14 ENCOUNTER — Encounter: Payer: BC Managed Care – PPO | Admitting: Osteopathic Medicine

## 2018-12-10 ENCOUNTER — Encounter: Payer: Self-pay | Admitting: Osteopathic Medicine

## 2018-12-10 ENCOUNTER — Other Ambulatory Visit (HOSPITAL_COMMUNITY)
Admission: RE | Admit: 2018-12-10 | Discharge: 2018-12-10 | Disposition: A | Discharge status: Home / Self Care | Payer: BC Managed Care – PPO | Source: Ambulatory Visit | Attending: Osteopathic Medicine | Admitting: Osteopathic Medicine

## 2018-12-10 ENCOUNTER — Ambulatory Visit (INDEPENDENT_AMBULATORY_CARE_PROVIDER_SITE_OTHER): Payer: BC Managed Care – PPO | Admitting: Osteopathic Medicine

## 2018-12-10 ENCOUNTER — Other Ambulatory Visit: Payer: Self-pay

## 2018-12-10 VITALS — BP 136/80 | HR 68 | Temp 98.4°F | Wt 168.0 lb

## 2018-12-10 DIAGNOSIS — R7303 Prediabetes: Secondary | ICD-10-CM | POA: Diagnosis not present

## 2018-12-10 DIAGNOSIS — Z124 Encounter for screening for malignant neoplasm of cervix: Secondary | ICD-10-CM

## 2018-12-10 DIAGNOSIS — Z0000 Encounter for general adult medical examination without abnormal findings: Secondary | ICD-10-CM | POA: Diagnosis not present

## 2018-12-10 DIAGNOSIS — N1831 Chronic kidney disease, stage 3a: Secondary | ICD-10-CM

## 2018-12-10 LAB — POCT GLYCOSYLATED HEMOGLOBIN (HGB A1C): Hemoglobin A1C: 5.9 % — AB (ref 4.0–5.6)

## 2018-12-10 NOTE — Progress Notes (Signed)
HPI: Kathy Gutierrez is a 63 y.o. female who  has a past medical history of High blood pressure and High cholesterol.  she presents to [facility] today, 12/10/18,  for chief complaint of: Annual     Patient here for annual physical / wellness exam.  See preventive care reviewed as below.  Recent labs reviewed in detail with the patient.  -CKD3 stable -cholesterol borderline, stable -prediabetes stable, not taking metformin   Additional concerns today include:  None!     Past medical, surgical, social and family history reviewed:  Patient Active Problem List   Diagnosis Date Noted  . Prediabetes 08/08/2018  . Essential hypertension 06/15/2017  . Gastroesophageal reflux disease 06/15/2017  . Mixed hyperlipidemia 06/15/2017  . Anxiety and depression 06/15/2017  . Genital herpes simplex 06/15/2017    No past surgical history on file.  Social History   Tobacco Use  . Smoking status: Never Smoker  . Smokeless tobacco: Never Used  Substance Use Topics  . Alcohol use: Not Currently    Family History  Problem Relation Age of Onset  . Diabetes Mother   . High Cholesterol Mother   . High blood pressure Mother   . Aortic aneurysm Mother   . High blood pressure Father   . Anuerysm Sister   . Multiple sclerosis Sister      Current medication list and allergy/intolerance information reviewed:    Current Outpatient Medications  Medication Sig Dispense Refill  . atorvastatin (LIPITOR) 20 MG tablet Take 1 tablet (20 mg total) by mouth daily. 90 tablet 3  . bisoprolol-hydrochlorothiazide (ZIAC) 5-6.25 MG tablet Take 1 tablet by mouth daily. 90 tablet 1  . buPROPion (WELLBUTRIN XL) 300 MG 24 hr tablet TK 1 T PO QAM  3  . fluticasone (FLONASE) 50 MCG/ACT nasal spray One spray in each nostril twice a day, use left hand for right nostril, and right hand for left nostril. 48 g 3  . metFORMIN (GLUCOPHAGE XR) 750 MG 24 hr tablet Take 1 tablet (750  mg total) by mouth daily with breakfast. 90 tablet 1  . Multiple Vitamins-Calcium (ONE-A-DAY WOMENS PO) Take by mouth.    Marland Kitchen omeprazole (PRILOSEC) 40 MG capsule Take 1 capsule (40 mg total) by mouth daily. 90 capsule 3  . valACYclovir (VALTREX) 500 MG tablet Take 1 tablet (500 mg total) by mouth daily. 90 tablet 3   No current facility-administered medications for this visit.     No Known Allergies    Review of Systems:  Constitutional:  No  fever, no chills, No recent illness, No unintentional weight changes. No significant fatigue.   HEENT: No  headache, no vision change, no hearing change, No sore throat, No  sinus pressure  Cardiac: No  chest pain, No  pressure, No palpitations, No  Orthopnea  Respiratory:  No  shortness of breath. No  Cough  Gastrointestinal: No  abdominal pain, No  nausea, No  vomiting,  No  blood in stool, No  diarrhea, No  constipation   Musculoskeletal: No new myalgia/arthralgia  Skin: No  Rash, No other wounds/concerning lesions  Genitourinary: No  incontinence, No  abnormal genital bleeding, No abnormal genital discharge  Hem/Onc: No  easy bruising/bleeding, No  abnormal lymph node  Endocrine: No cold intolerance,  No heat intolerance. No polyuria/polydipsia/polyphagia   Neurologic: No  weakness, No  dizziness, No  slurred speech/focal weakness/facial droop  Psychiatric: No  concerns with depression, No  concerns with anxiety, No sleep  problems, No mood problems  Exam:  There were no vitals taken for this visit.  BP Readings from Last 3 Encounters:  08/08/18 135/79  01/02/18 128/75  11/24/17 120/70    Constitutional: VS see above. General Appearance: alert, well-developed, well-nourished, NAD  Eyes: Normal lids and conjunctive, non-icteric sclera  Ears, Nose, Mouth, Throat: TM normal bilaterally.   Neck: No masses, trachea midline. No thyroid enlargement. No tenderness/mass appreciated. No lymphadenopathy  Respiratory: Normal respiratory  effort. no wheeze, no rhonchi, no rales  Cardiovascular: S1/S2 normal, no murmur, no rub/gallop auscultated. RRR. No lower extremity edema.   Gastrointestinal: Nontender, no masses. No hepatomegaly, no splenomegaly. No hernia appreciated. Bowel sounds normal. Rectal exam deferred.   Musculoskeletal: Gait normal. No clubbing/cyanosis of digits.   Neurological: Normal balance/coordination. No tremor. No cranial nerve deficit on limited exam. Motor and sensation intact and symmetric. Cerebellar reflexes intact.   Skin: warm, dry, intact. No rash/ulcer. No concerning nevi or subq nodules on limited exam.    Psychiatric: Normal judgment/insight. Normal mood and affect. Oriented x3.   GYN: No lesions/ulcers to external genitalia, normal urethra, normal vaginal mucosa, physiologic discharge, cervix normal without lesions, uterus not enlarged or tender, adnexa no masses and nontender  BREAST: No rashes/skin changes, normal fibrous breast tissue, no masses or tenderness, normal nipple without discharge, normal axilla       ASSESSMENT/PLAN: The primary encounter diagnosis was Annual physical exam. Diagnoses of Prediabetes and Cervical cancer screening were also pertinent to this visit.   Orders Placed This Encounter  Procedures  . POCT HgB A1C   The 10-year ASCVD risk score [name] DC Jr., et al., 2013) is: 8.4%   Values used to calculate the score:     Age: 71 years     Sex: Female     Is Non-Hispanic African American: Yes     Diabetic: No     Tobacco smoker: No     Systolic Blood Pressure: 952 mmHg     Is BP treated: Yes     HDL Cholesterol: 46 mg/dL     Total Cholesterol: 178 mg/dL  No orders of the defined types were placed in this encounter.   Patient Instructions  General Preventive Care  Most recent routine screening labs done 04/2018. Need monitoring of A1C.   Blood pressure goal 130/80.   Tobacco: don't!   Alcohol: responsible moderation is ok for most adults - if  you have concerns about your alcohol intake, please talk to me!   Exercise: as tolerated to reduce risk of cardiovascular disease and diabetes. Strength training will also prevent osteoporosis.   Mental health: if need for mental health care (medicines, counseling, other), or concerns about moods, please let me know!   Sexual health: if need for STD testing, or if concerns with libido/pain problems, please let me know!  Advanced Directive: Living Will and/or Healthcare Power of Attorney recommended for all adults, regardless of age or health.  Vaccines  Flu vaccine: recommended for almost everyone, every fall.   Shingles vaccine: Shingrix recommended after age 81. Due for second shot, unless this was done elsewhere and we don't have a record?   Pneumonia vaccines: Prevnar and Pneumovax recommended after age 17, or sooner if certain medical conditions.  Tetanus booster: Tdap recommended every 10 years. Due 2027. Marland Kitchen  Cancer screenings   Colon cancer screening: recommended for everyone at age 74, but some folks need a colonoscopy sooner if risk factors - please follow up per GI recommendations  Breast cancer screening: mammogram recommended annually after age 61. Due 03/2019  Cervical cancer screening: Pap every 1 to 5 years depending on age and other risk factors. Can usually stop at age 10065 or w/ hysterectomy.   Lung cancer screening: not needed for non-smokers  Infection screenings . HIV: recommended screening at least once age 61-65, more often as needed. . Gonorrhea/Chlamydia: screening as needed . Hepatitis C: recommended for anyone born [birth year][address]1963. Done 05/2017.  . TB: certain at-risk populations, or depending on work requirements and/or travel history Other . Bone Density Test: recommended for women at age 61         Visit summary with medication list and pertinent instructions was printed for patient to review. All questions at time of visit were answered - patient  instructed to contact office with any additional concerns or updates. ER/RTC precautions were reviewed with the patient.     Please note: voice recognition software was used to produce this document, and typos may escape review. Please contact Dr. Lyn HollingsheadAlexander for any needed clarifications.     Follow-up plan: Return in about 6 months (around 06/10/2019) for monitor A1C and BP. 1 year annual w/ Dr Lyn HollingsheadAlexander! .Marland Kitchen

## 2018-12-10 NOTE — Patient Instructions (Addendum)
General Preventive Care  Most recent routine screening labs done 04/2018. Need monitoring of A1C.   Blood pressure goal 130/80.   Tobacco: don't!   Alcohol: responsible moderation is ok for most adults - if you have concerns about your alcohol intake, please talk to me!   Exercise: as tolerated to reduce risk of cardiovascular disease and diabetes. Strength training will also prevent osteoporosis.   Mental health: if need for mental health care (medicines, counseling, other), or concerns about moods, please let me know!   Sexual health: if need for STD testing, or if concerns with libido/pain problems, please let me know!  Advanced Directive: Living Will and/or Healthcare Power of Attorney recommended for all adults, regardless of age or health.  Vaccines  Flu vaccine: recommended for almost everyone, every fall.   Shingles vaccine: Shingrix recommended after age 23. Due for second shot, unless this was done elsewhere and we don't have a record?   Pneumonia vaccines: Prevnar and Pneumovax recommended after age 4, or sooner if certain medical conditions.  Tetanus booster: Tdap recommended every 10 years. Due 2027. Marland Kitchen  Cancer screenings   Colon cancer screening: recommended for everyone at age 80, but some folks need a colonoscopy sooner if risk factors - please follow up per GI recommendations   Breast cancer screening: mammogram recommended annually after age 89. Due 03/2019  Cervical cancer screening: Pap every 1 to 5 years depending on age and other risk factors. Can usually stop at age 28 or w/ hysterectomy.   Lung cancer screening: not needed for non-smokers  Infection screenings . HIV: recommended screening at least once age 43-65, more often as needed. . Gonorrhea/Chlamydia: screening as needed . Hepatitis C: recommended for anyone born 05-1961. Done 05/2017.  . TB: certain at-risk populations, or depending on work requirements and/or travel history Other . Bone  Density Test: recommended for women at age 59

## 2018-12-18 LAB — CYTOLOGY - PAP
Comment: NEGATIVE
Diagnosis: NEGATIVE
High risk HPV: NEGATIVE

## 2019-01-21 ENCOUNTER — Other Ambulatory Visit: Payer: Self-pay | Admitting: Osteopathic Medicine

## 2019-01-21 DIAGNOSIS — E782 Mixed hyperlipidemia: Secondary | ICD-10-CM

## 2019-01-22 DIAGNOSIS — M7061 Trochanteric bursitis, right hip: Secondary | ICD-10-CM | POA: Insufficient documentation

## 2019-01-29 ENCOUNTER — Other Ambulatory Visit: Payer: Self-pay

## 2019-01-29 DIAGNOSIS — I10 Essential (primary) hypertension: Secondary | ICD-10-CM

## 2019-01-29 MED ORDER — BISOPROLOL-HYDROCHLOROTHIAZIDE 5-6.25 MG PO TABS: 1.0000 | ORAL_TABLET | Freq: Every day | ORAL | 1 refills | Status: DC

## 2019-01-29 MED ORDER — METFORMIN HCL ER 750 MG PO TB24: 750.0000 mg | ORAL_TABLET | Freq: Every day | ORAL | 1 refills | Status: DC

## 2019-01-30 ENCOUNTER — Encounter: Payer: Self-pay | Admitting: Osteopathic Medicine

## 2019-03-06 ENCOUNTER — Other Ambulatory Visit: Payer: Self-pay | Admitting: *Deleted

## 2019-03-06 DIAGNOSIS — F32A Depression, unspecified: Secondary | ICD-10-CM

## 2019-03-06 DIAGNOSIS — F329 Major depressive disorder, single episode, unspecified: Secondary | ICD-10-CM

## 2019-03-06 DIAGNOSIS — F419 Anxiety disorder, unspecified: Secondary | ICD-10-CM

## 2019-03-06 MED ORDER — BUPROPION HCL ER (XL) 300 MG PO TB24: ORAL_TABLET | ORAL | 3 refills | Status: DC

## 2019-04-21 ENCOUNTER — Encounter: Payer: Self-pay | Admitting: Osteopathic Medicine

## 2019-04-22 ENCOUNTER — Other Ambulatory Visit: Payer: Self-pay | Admitting: Family Medicine

## 2019-04-22 MED ORDER — FLUCONAZOLE 150 MG PO TABS: 150.0000 mg | ORAL_TABLET | Freq: Once | ORAL | 0 refills | Status: AC

## 2019-05-03 LAB — HM MAMMOGRAPHY

## 2019-05-09 ENCOUNTER — Encounter: Payer: Self-pay | Admitting: Osteopathic Medicine

## 2019-05-21 ENCOUNTER — Encounter: Payer: Self-pay | Admitting: Osteopathic Medicine

## 2019-05-21 DIAGNOSIS — B379 Candidiasis, unspecified: Secondary | ICD-10-CM

## 2019-05-23 MED ORDER — FLUCONAZOLE 150 MG PO TABS: 150.0000 mg | ORAL_TABLET | Freq: Once | ORAL | 0 refills | Status: AC

## 2019-05-23 NOTE — Telephone Encounter (Signed)
Forwarding to covering provider.

## 2019-06-06 ENCOUNTER — Telehealth: Payer: Self-pay | Admitting: Osteopathic Medicine

## 2019-06-06 DIAGNOSIS — Z0000 Encounter for general adult medical examination without abnormal findings: Secondary | ICD-10-CM

## 2019-06-06 DIAGNOSIS — E782 Mixed hyperlipidemia: Secondary | ICD-10-CM

## 2019-06-06 NOTE — Telephone Encounter (Signed)
PT requested Blood work to be put in. She has a 6 month follow up on the 20th. Please advise.

## 2019-06-10 ENCOUNTER — Ambulatory Visit: Payer: BC Managed Care – PPO | Admitting: Osteopathic Medicine

## 2019-06-12 ENCOUNTER — Ambulatory Visit: Payer: BC Managed Care – PPO | Admitting: Osteopathic Medicine

## 2019-06-13 ENCOUNTER — Ambulatory Visit: Payer: BC Managed Care – PPO | Admitting: Osteopathic Medicine

## 2019-06-14 NOTE — Telephone Encounter (Signed)
Ordered follow up labs for preventative maintenance.

## 2019-06-14 NOTE — Addendum Note (Signed)
Addended by: Chalmers Cater on: 06/14/2019 09:23 AM   Modules accepted: Orders

## 2019-06-15 LAB — CBC WITH DIFFERENTIAL/PLATELET
Absolute Monocytes: 396 cells/uL (ref 200–950)
Basophils Absolute: 39 cells/uL (ref 0–200)
Basophils Relative: 0.7 %
Eosinophils Absolute: 110 cells/uL (ref 15–500)
Eosinophils Relative: 2 %
HCT: 35.5 % (ref 35.0–45.0)
Hemoglobin: 11.8 g/dL (ref 11.7–15.5)
Lymphs Abs: 2310 cells/uL (ref 850–3900)
MCH: 31.1 pg (ref 27.0–33.0)
MCHC: 33.2 g/dL (ref 32.0–36.0)
MCV: 93.4 fL (ref 80.0–100.0)
MPV: 10.5 fL (ref 7.5–12.5)
Monocytes Relative: 7.2 %
Neutro Abs: 2646 cells/uL (ref 1500–7800)
Neutrophils Relative %: 48.1 %
Platelets: 283 10*3/uL (ref 140–400)
RBC: 3.8 10*6/uL (ref 3.80–5.10)
RDW: 13.1 % (ref 11.0–15.0)
Total Lymphocyte: 42 %
WBC: 5.5 10*3/uL (ref 3.8–10.8)

## 2019-06-15 LAB — COMPLETE METABOLIC PANEL WITH GFR
AG Ratio: 1.8 (calc) (ref 1.0–2.5)
ALT: 15 U/L (ref 6–29)
AST: 17 U/L (ref 10–35)
Albumin: 4.3 g/dL (ref 3.6–5.1)
Alkaline phosphatase (APISO): 58 U/L (ref 37–153)
BUN/Creatinine Ratio: 6 (calc) (ref 6–22)
BUN: 7 mg/dL (ref 7–25)
CO2: 30 mmol/L (ref 20–32)
Calcium: 9.8 mg/dL (ref 8.6–10.4)
Chloride: 105 mmol/L (ref 98–110)
Creat: 1.08 mg/dL — ABNORMAL HIGH (ref 0.50–0.99)
GFR, Est African American: 64 mL/min/{1.73_m2} (ref >=60)
GFR, Est Non African American: 55 mL/min/{1.73_m2} — ABNORMAL LOW (ref >=60)
Globulin: 2.4 g/dL (calc) (ref 1.9–3.7)
Glucose, Bld: 109 mg/dL — ABNORMAL HIGH (ref 65–99)
Potassium: 3.8 mmol/L (ref 3.5–5.3)
Sodium: 142 mmol/L (ref 135–146)
Total Bilirubin: 0.4 mg/dL (ref 0.2–1.2)
Total Protein: 6.7 g/dL (ref 6.1–8.1)

## 2019-06-15 LAB — LIPID PANEL W/REFLEX DIRECT LDL
Cholesterol: 194 mg/dL (ref <200)
HDL: 42 mg/dL — ABNORMAL LOW (ref >=50)
LDL Cholesterol (Calc): 123 mg/dL (calc) — ABNORMAL HIGH
Non-HDL Cholesterol (Calc): 152 mg/dL (calc) — ABNORMAL HIGH (ref <130)
Total CHOL/HDL Ratio: 4.6 (calc) (ref <5.0)
Triglycerides: 170 mg/dL — ABNORMAL HIGH (ref <150)

## 2019-06-18 ENCOUNTER — Encounter: Payer: Self-pay | Admitting: Osteopathic Medicine

## 2019-06-18 ENCOUNTER — Ambulatory Visit (INDEPENDENT_AMBULATORY_CARE_PROVIDER_SITE_OTHER): Payer: BC Managed Care – PPO | Admitting: Osteopathic Medicine

## 2019-06-18 ENCOUNTER — Other Ambulatory Visit: Payer: Self-pay

## 2019-06-18 VITALS — BP 127/77 | HR 59 | Temp 97.8°F | Wt 166.1 lb

## 2019-06-18 DIAGNOSIS — N1831 Chronic kidney disease, stage 3a: Secondary | ICD-10-CM

## 2019-06-18 DIAGNOSIS — R7303 Prediabetes: Secondary | ICD-10-CM | POA: Diagnosis not present

## 2019-06-18 DIAGNOSIS — Z7189 Other specified counseling: Secondary | ICD-10-CM

## 2019-06-18 DIAGNOSIS — I10 Essential (primary) hypertension: Secondary | ICD-10-CM

## 2019-06-18 DIAGNOSIS — E782 Mixed hyperlipidemia: Secondary | ICD-10-CM | POA: Diagnosis not present

## 2019-06-18 LAB — POCT GLYCOSYLATED HEMOGLOBIN (HGB A1C): Hemoglobin A1C: 5.8 % — AB (ref 4.0–5.6)

## 2019-06-18 MED ORDER — METFORMIN HCL ER 750 MG PO TB24: 375.0000 mg | ORAL_TABLET | Freq: Every day | ORAL | 1 refills | Status: DC

## 2019-06-18 MED ORDER — FLUCONAZOLE 150 MG PO TABS: 150.0000 mg | ORAL_TABLET | Freq: Once | ORAL | 1 refills | Status: AC

## 2019-06-18 NOTE — Progress Notes (Signed)
Kathy Gutierrez is a 64 y.o. female who presents to  [facility]  today, 06/18/19, seeking care for the following: . A1C prediabetes, BP monitoring, lab review     ASSESSMENT & PLAN with other pertinent history/findings:  The primary encounter diagnosis was Prediabetes. Diagnoses of Mixed hyperlipidemia, Essential hypertension, Stage 3a chronic kidney disease, and Cardiac risk counseling were also pertinent to this visit.  1. Prediabetes A1C today was 5.8 Last check was 11/2018 at 5.9% Taking Metformin XR 750 mg - has been taking HALF tablet for few weeks   2. Mixed hyperlipidemia Atorvastatin 20 mg daily  Last LDL was 105 on 06/14/2019  3. Essential hypertension BP Readings :  06/18/19 127/77  12/10/18 136/80  08/08/18 135/79  01/02/18 128/75   4. CKD3 Stable Cr, no concerns  Will get urine microalbumin next visit   5. Cardiac risk counseling  Statin, would maybe consider ASA, BP is at goal, A1C is 5.8 The 10-year ASCVD risk score [name] DC [name]., et al., 2013) is: 8.2%   Values used to calculate the score:     Age: 71 years     Sex: Female     Is Non-Hispanic African American: Yes     Diabetic: No     Tobacco smoker: No     Systolic Blood Pressure: 308 mmHg     Is BP treated: Yes     HDL Cholesterol: 42 mg/dL     Total Cholesterol: 194 mg/dL  6. Yeast infection Diflucan sent  Exam if no better / recurrence  Diflucan usually clears it up 1st dose   There are no Patient Instructions on file for this visit.   Orders Placed This Encounter  Procedures  . COMPLETE METABOLIC PANEL WITH GFR  . Lipid panel  . Hemoglobin A1c  . POCT HgB A1C    Meds ordered this encounter  Medications  . metFORMIN (GLUCOPHAGE XR) 750 MG 24 hr tablet    Sig: Take 1 tablet (750 mg total) by mouth daily with breakfast.    Dispense:  90 tablet    Refill:  1  . fluconazole (DIFLUCAN) 150 MG tablet    Sig: Take 1 tablet (150 mg  total) by mouth once for 1 dose. Repeat dose 72 hours if yeast infection persists    Dispense:  2 tablet    Refill:  1       Follow-up instructions: Return in about 3 months (around 09/17/2019) for LAB VISIT ONLY, AND 6 MOS FOR ROUTINE ANNUAL IN OFFICE .  Labs for annual (plus other dx to code labs ): A1C (prediabetes), lipid (HLD), CBC, CMP (CKD3), urine microalbumin (CKD3), Vitamin D (CKD3), TSH (CKD3)                                       BP 127/77 (BP Location: Left Arm, Patient Position: Sitting, Cuff Size: Normal)   Pulse (!) 59   Temp 97.8 F (36.6 C) (Oral)   Wt 166 lb 1.9 oz (75.4 kg)   BMI 27.64 kg/m   Current Meds  Medication Sig  . atorvastatin (LIPITOR) 20 MG tablet TAKE 1 TABLET(20 MG) BY MOUTH DAILY  . bisoprolol-hydrochlorothiazide (ZIAC) 5-6.25 MG tablet Take 1 tablet by mouth daily.  Marland Kitchen buPROPion (WELLBUTRIN XL) 300 MG 24 hr tablet TK 1 T PO QAM  . fluticasone (FLONASE) 50 MCG/ACT nasal  spray One spray in each nostril twice a day, use left hand for right nostril, and right hand for left nostril.  . metFORMIN (GLUCOPHAGE XR) 750 MG 24 hr tablet Take 1 tablet (750 mg total) by mouth daily with breakfast.  . Multiple Vitamins-Calcium (ONE-A-DAY WOMENS PO) Take by mouth.  Marland Kitchen omeprazole (PRILOSEC) 40 MG capsule Take 1 capsule (40 mg total) by mouth daily.  . valACYclovir (VALTREX) 500 MG tablet Take 1 tablet (500 mg total) by mouth daily.  . [DISCONTINUED] metFORMIN (GLUCOPHAGE XR) 750 MG 24 hr tablet Take 1 tablet (750 mg total) by mouth daily with breakfast.    Results for orders placed or performed in visit on 06/18/19 (from the past 72 hour(s))  POCT HgB A1C     Status: Abnormal   Collection Time: 06/18/19  9:10 AM  Result Value Ref Range   Hemoglobin A1C 5.8 (A) 4.0 - 5.6 %   HbA1c POC (<> result, manual entry)     HbA1c, POC (prediabetic range)     HbA1c, POC (controlled diabetic range)      No results  found.  Depression screen [facility] 2/9 12/10/2018 01/02/2018 06/15/2017  Decreased Interest 0 0 0  Down, Depressed, Hopeless 0 0 1  PHQ - 2 Score 0 0 1  Altered sleeping - 0 0  Tired, decreased energy - 1 1  Change in appetite - 0 0  Feeling bad or failure about yourself  - 0 0  Trouble concentrating - 0 0  Moving slowly or fidgety/restless - 0 0  Suicidal thoughts - 0 0  PHQ-9 Score - 1 2  Difficult doing work/chores - Not difficult at all -    GAD 7 : Generalized Anxiety Score 12/10/2018 01/02/2018  Nervous, Anxious, on Edge 0 0  Control/stop worrying 0 0  Worry too much - different things 1 0  Trouble relaxing 0 0  Restless 0 0  Easily annoyed or irritable 0 0  Afraid - awful might happen 0 0  Total GAD 7 Score 1 0  Anxiety Difficulty Not difficult at all -      All questions at time of visit were answered - patient instructed to contact office with any additional concerns or updates.  ER/RTC precautions were reviewed with the patient.  Please note: voice recognition software was used to produce this document, and typos may escape review. Please contact Dr. Lyn Hollingshead for any needed clarifications.   Total encounter time: 30 minutes.

## 2019-06-27 ENCOUNTER — Other Ambulatory Visit: Payer: Self-pay

## 2019-06-27 DIAGNOSIS — A6000 Herpesviral infection of urogenital system, unspecified: Secondary | ICD-10-CM

## 2019-06-27 MED ORDER — VALACYCLOVIR HCL 500 MG PO TABS: 500.0000 mg | ORAL_TABLET | Freq: Every day | ORAL | 3 refills | Status: DC

## 2019-07-25 IMAGING — DX DG CHEST 2V
2 series · 2 of 2 positions shown · non-contrast
Comparison: Chest x-ray of 11/07/2016

CLINICAL DATA: History of pulmonary nodule, follow-up

EXAM:
CHEST - 2 VIEW

[chest pa]
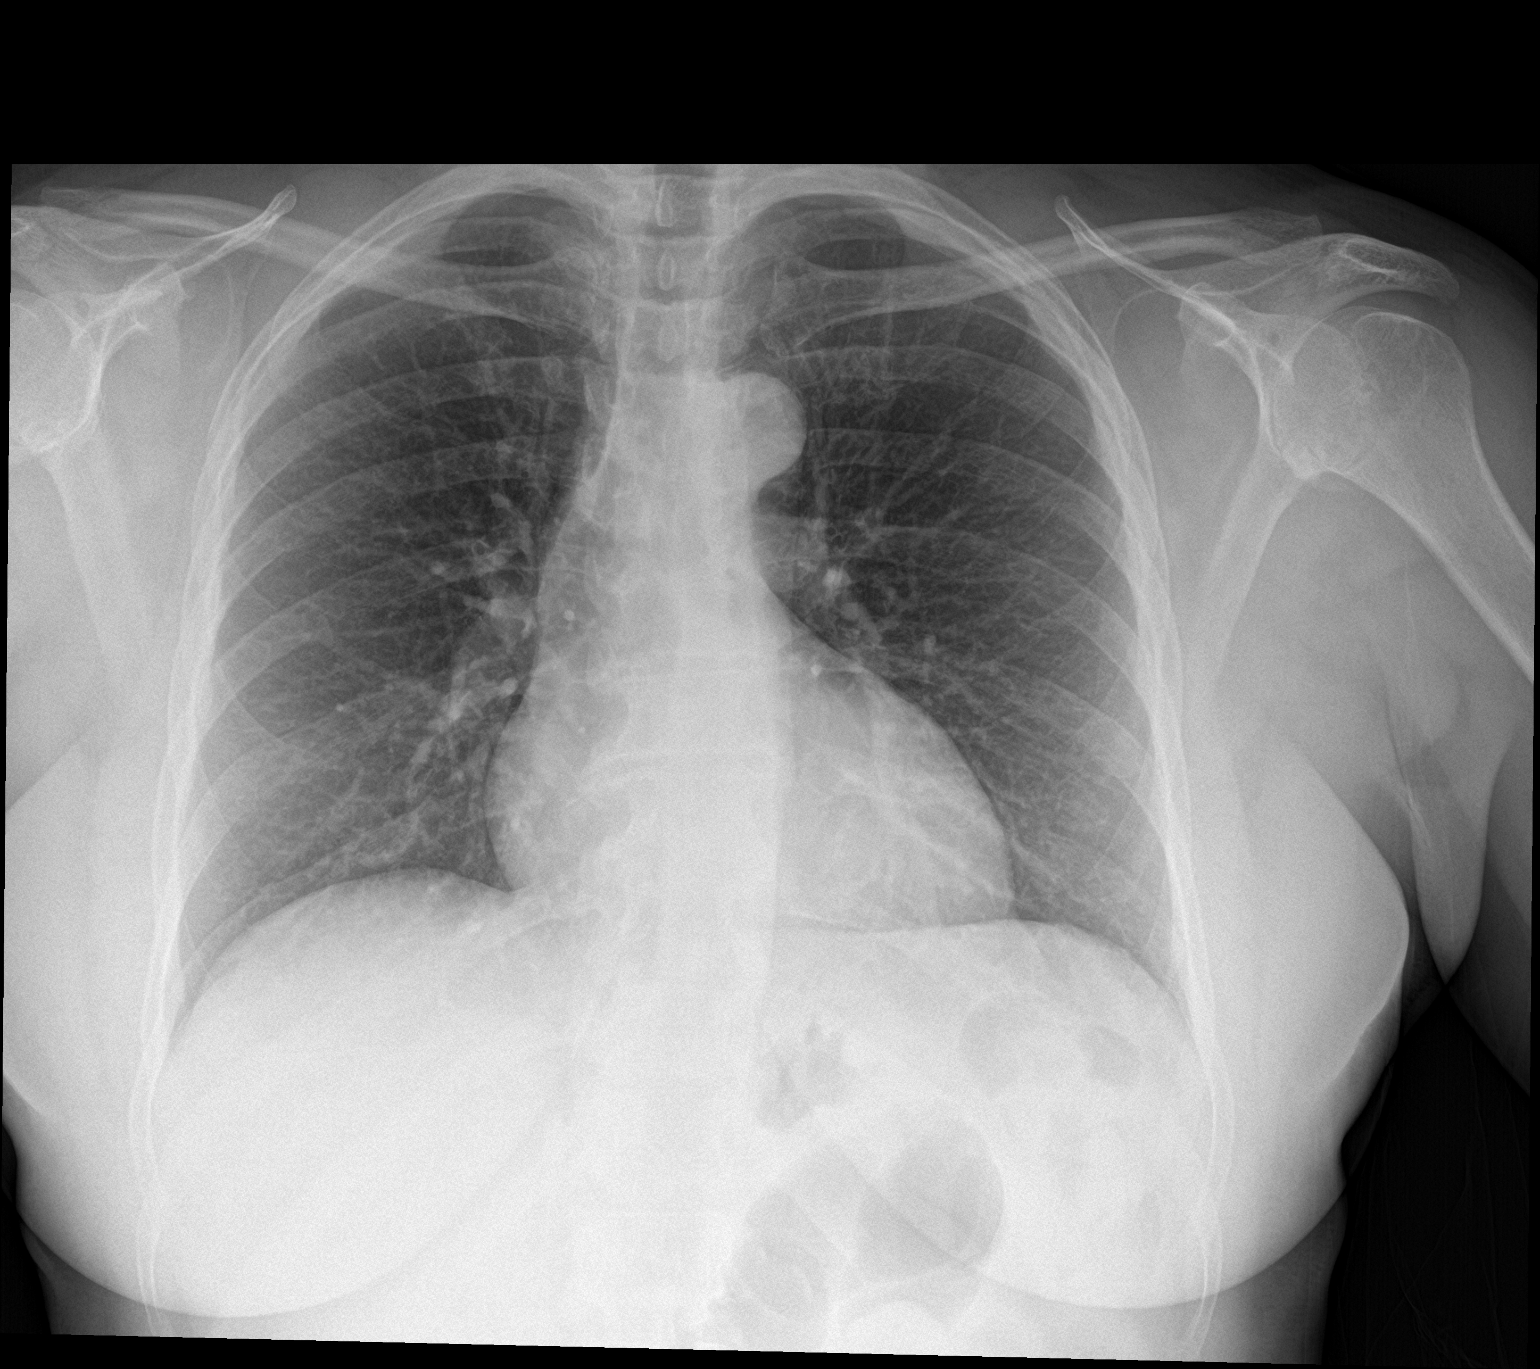

[chest lat]
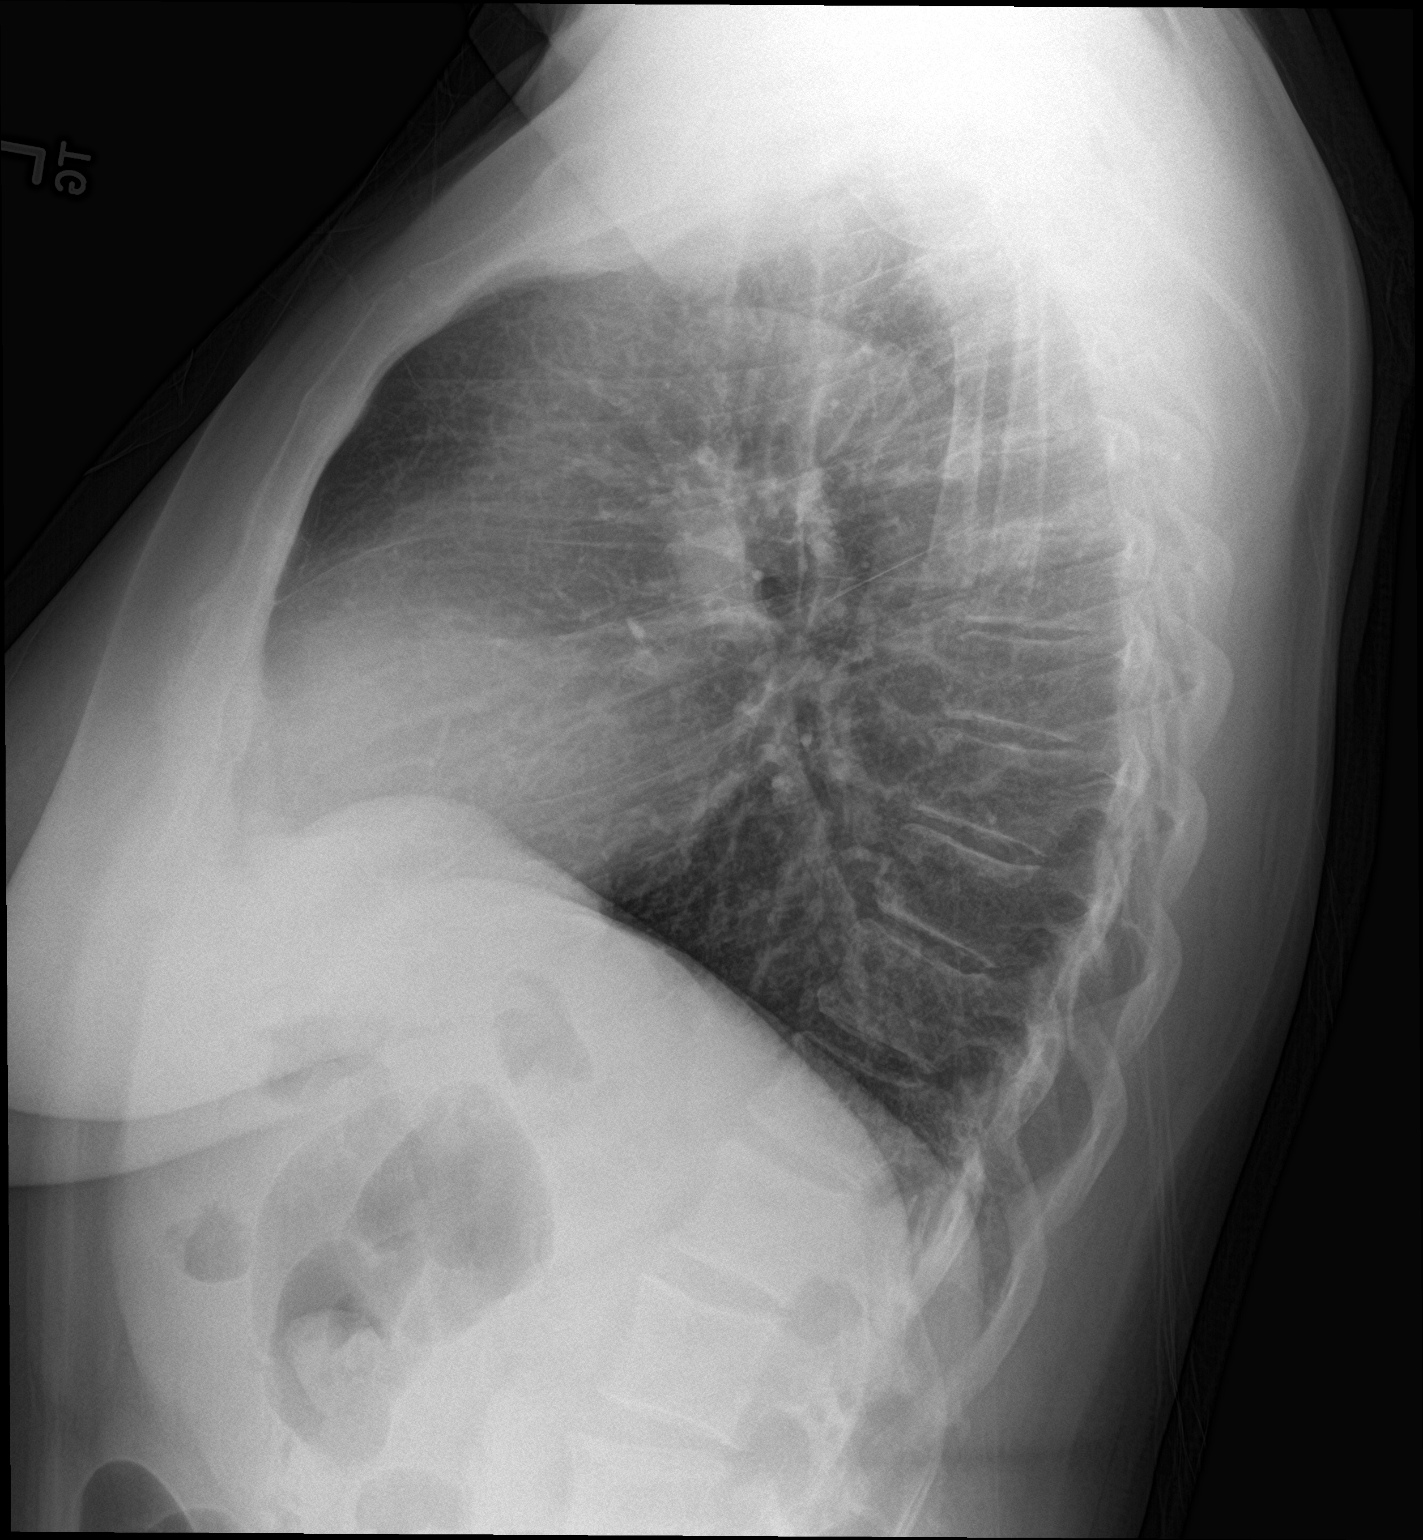

[2 of 2 positions shown; findings below may reference images not displayed]

FINDINGS: The area questioned previously within the medial right lung apex
appears to represent bony overlap. No suspicious lung nodule is
seen. Mediastinal and hilar contours are unremarkable. The heart is
within upper limits of normal. No acute bony abnormality is seen.
IMPRESSION: No persistent lung nodule is seen. The area previously questioned
represents bony overlap. No active lung disease.

## 2019-07-30 ENCOUNTER — Other Ambulatory Visit: Payer: Self-pay

## 2019-07-30 ENCOUNTER — Encounter: Payer: Self-pay | Admitting: Osteopathic Medicine

## 2019-07-30 DIAGNOSIS — I10 Essential (primary) hypertension: Secondary | ICD-10-CM

## 2019-07-30 MED ORDER — BISOPROLOL-HYDROCHLOROTHIAZIDE 5-6.25 MG PO TABS: 1.0000 | ORAL_TABLET | Freq: Every day | ORAL | 1 refills | Status: DC

## 2019-10-30 ENCOUNTER — Other Ambulatory Visit: Payer: Self-pay | Admitting: Osteopathic Medicine

## 2019-10-30 DIAGNOSIS — I10 Essential (primary) hypertension: Secondary | ICD-10-CM

## 2019-11-12 ENCOUNTER — Encounter: Payer: Self-pay | Admitting: Physician Assistant

## 2019-11-12 ENCOUNTER — Ambulatory Visit (INDEPENDENT_AMBULATORY_CARE_PROVIDER_SITE_OTHER): Payer: BC Managed Care – PPO | Admitting: Physician Assistant

## 2019-11-12 VITALS — BP 138/62 | HR 62 | Ht 65.0 in | Wt 165.0 lb

## 2019-11-12 DIAGNOSIS — R319 Hematuria, unspecified: Secondary | ICD-10-CM | POA: Diagnosis not present

## 2019-11-12 DIAGNOSIS — Z23 Encounter for immunization: Secondary | ICD-10-CM | POA: Diagnosis not present

## 2019-11-12 DIAGNOSIS — M545 Low back pain, unspecified: Secondary | ICD-10-CM

## 2019-11-12 LAB — POCT URINALYSIS DIP (CLINITEK)
Bilirubin, UA: NEGATIVE
Glucose, UA: NEGATIVE mg/dL
Ketones, POC UA: NEGATIVE mg/dL
Nitrite, UA: NEGATIVE
POC PROTEIN,UA: NEGATIVE
Spec Grav, UA: <=1.005 — AB (ref 1.010–1.025)
Urobilinogen, UA: 0.2 E.U./dL
pH, UA: 6.5 (ref 5.0–8.0)

## 2019-11-12 MED ORDER — CYCLOBENZAPRINE HCL 10 MG PO TABS: 10.0000 mg | ORAL_TABLET | Freq: Three times a day (TID) | ORAL | 0 refills | Status: DC | PRN

## 2019-11-12 MED ORDER — KETOROLAC TROMETHAMINE 30 MG/ML IJ SOLN
30.0000 mg | Freq: Once | INTRAMUSCULAR | Status: AC
  Administered 2019-11-12: 30 mg via INTRAMUSCULAR

## 2019-11-12 NOTE — Patient Instructions (Addendum)
2 weeks repeat urine if nothing found in urine culture.  Icy hot patches/consider massage/tennis Chiropodist.  Muscle relaxer as needed.    Low Back Sprain or Strain Rehab Ask your health care provider which exercises are safe for you. Do exercises exactly as told by your health care provider and adjust them as directed. It is normal to feel mild stretching, pulling, tightness, or discomfort as you do these exercises. Stop right away if you feel sudden pain or your pain gets worse. Do not begin these exercises until told by your health care provider. Stretching and range-of-motion exercises These exercises warm up your muscles and joints and improve the movement and flexibility of your back. These exercises also help to relieve pain, numbness, and tingling. Lumbar rotation  1. Lie on your back on a firm surface and bend your knees. 2. Straighten your arms out to your sides so each arm forms a 90-degree angle (right angle) with a side of your body. 3. Slowly move (rotate) both of your knees to one side of your body until you feel a stretch in your lower back (lumbar). Try not to let your shoulders lift off the floor. 4. Hold this position for __________ seconds. 5. Tense your abdominal muscles and slowly move your knees back to the starting position. 6. Repeat this exercise on the other side of your body. Repeat __________ times. Complete this exercise __________ times a day. Single knee to chest  1. Lie on your back on a firm surface with both legs straight. 2. Bend one of your knees. Use your hands to move your knee up toward your chest until you feel a gentle stretch in your lower back and buttock. ? Hold your leg in this position by holding on to the front of your knee. ? Keep your other leg as straight as possible. 3. Hold this position for __________ seconds. 4. Slowly return to the starting position. 5. Repeat with your other leg. Repeat __________ times.  Complete this exercise __________ times a day. Prone extension on elbows  1. Lie on your abdomen on a firm surface (prone position). 2. Prop yourself up on your elbows. 3. Use your arms to help lift your chest up until you feel a gentle stretch in your abdomen and your lower back. ? This will place some of your body weight on your elbows. If this is uncomfortable, try stacking pillows under your chest. ? Your hips should stay down, against the surface that you are lying on. Keep your hip and back muscles relaxed. 4. Hold this position for __________ seconds. 5. Slowly relax your upper body and return to the starting position. Repeat __________ times. Complete this exercise __________ times a day. Strengthening exercises These exercises build strength and endurance in your back. Endurance is the ability to use your muscles for a long time, even after they get tired. Pelvic tilt This exercise strengthens the muscles that lie deep in the abdomen. 1. Lie on your back on a firm surface. Bend your knees and keep your feet flat on the floor. 2. Tense your abdominal muscles. Tip your pelvis up toward the ceiling and flatten your lower back into the floor. ? To help with this exercise, you may place a small towel under your lower back and try to push your back into the towel. 3. Hold this position for __________ seconds. 4. Let your muscles relax completely before you repeat this exercise. Repeat __________ times. Complete this exercise __________ times  a day. Alternating arm and leg raises  1. Get on your hands and knees on a firm surface. If you are on a hard floor, you may want to use padding, such as an exercise mat, to cushion your knees. 2. Line up your arms and legs. Your hands should be directly below your shoulders, and your knees should be directly below your hips. 3. Lift your left leg behind you. At the same time, raise your right arm and straighten it in front of you. ? Do not lift your  leg higher than your hip. ? Do not lift your arm higher than your shoulder. ? Keep your abdominal and back muscles tight. ? Keep your hips facing the ground. ? Do not arch your back. ? Keep your balance carefully, and do not hold your breath. 4. Hold this position for __________ seconds. 5. Slowly return to the starting position. 6. Repeat with your right leg and your left arm. Repeat __________ times. Complete this exercise __________ times a day. Abdominal set with straight leg raise  1. Lie on your back on a firm surface. 2. Bend one of your knees and keep your other leg straight. 3. Tense your abdominal muscles and lift your straight leg up, 4-6 inches (10-15 cm) off the ground. 4. Keep your abdominal muscles tight and hold this position for __________ seconds. ? Do not hold your breath. ? Do not arch your back. Keep it flat against the ground. 5. Keep your abdominal muscles tense as you slowly lower your leg back to the starting position. 6. Repeat with your other leg. Repeat __________ times. Complete this exercise __________ times a day. Single leg lower with bent knees 1. Lie on your back on a firm surface. 2. Tense your abdominal muscles and lift your feet off the floor, one foot at a time, so your knees and hips are bent in 90-degree angles (right angles). ? Your knees should be over your hips and your lower legs should be parallel to the floor. 3. Keeping your abdominal muscles tense and your knee bent, slowly lower one of your legs so your toe touches the ground. 4. Lift your leg back up to return to the starting position. ? Do not hold your breath. ? Do not let your back arch. Keep your back flat against the ground. 5. Repeat with your other leg. Repeat __________ times. Complete this exercise __________ times a day. Posture and body mechanics Good posture and healthy body mechanics can help to relieve stress in your body's tissues and joints. Body mechanics refers to the  movements and positions of your body while you do your daily activities. Posture is part of body mechanics. Good posture means:  Your spine is in its natural S-curve position (neutral).  Your shoulders are pulled back slightly.  Your head is not tipped forward. Follow these guidelines to improve your posture and body mechanics in your everyday activities. Standing   When standing, keep your spine neutral and your feet about hip width apart. Keep a slight bend in your knees. Your ears, shoulders, and hips should line up.  When you do a task in which you stand in one place for a long time, place one foot up on a stable object that is 2-4 inches (5-10 cm) high, such as a footstool. This helps keep your spine neutral. Sitting   When sitting, keep your spine neutral and keep your feet flat on the floor. Use a footrest, if necessary, and keep your  thighs parallel to the floor. Avoid rounding your shoulders, and avoid tilting your head forward.  When working at a desk or a computer, keep your desk at a height where your hands are slightly lower than your elbows. Slide your chair under your desk so you are close enough to maintain good posture.  When working at a computer, place your monitor at a height where you are looking straight ahead and you do not have to tilt your head forward or downward to look at the screen. Resting  When lying down and resting, avoid positions that are most painful for you.  If you have pain with activities such as sitting, bending, stooping, or squatting, lie in a position in which your body does not bend very much. For example, avoid curling up on your side with your arms and knees near your chest (fetal position).  If you have pain with activities such as standing for a long time or reaching with your arms, lie with your spine in a neutral position and bend your knees slightly. Try the following positions: ? Lying on your side with a pillow between your  knees. ? Lying on your back with a pillow under your knees. Lifting   When lifting objects, keep your feet at least shoulder width apart and tighten your abdominal muscles.  Bend your knees and hips and keep your spine neutral. It is important to lift using the strength of your legs, not your back. Do not lock your knees straight out.  Always ask for help to lift heavy or awkward objects. This information is not intended to replace advice given to you by your health care provider. Make sure you discuss any questions you have with your health care provider. Document Revised: 06/08/2018 Document Reviewed: 03/08/2018 Elsevier Patient Education  2020 [employer].

## 2019-11-12 NOTE — Progress Notes (Signed)
Subjective:    Patient ID: Kathy Gutierrez, female    DOB: 1955-07-26, 64 y.o.   MRN: [id number]  HPI  Pt is a 64 yo female with HTN, CKD IIIa who presents to the clinic with left lower back pain for 3 days. Symptoms started Sunday suddenly and more in left flank area and then radiated into left low back. She does not have anymore abdominal pain at this point. Denies any hx of kidney stones. No dysuria, urinary frequency or urgency, vomiting, nausea, diarrhea, constipation, melena, hematochezia. Aleve has been helping. Pain at its worse was 9/10. Pain today 4/10. Pain is worse with movement especially twisting. No injury. Never had pain like this before.   .. Active Ambulatory Problems    Diagnosis Date Noted   Essential hypertension 06/15/2017   Gastroesophageal reflux disease 06/15/2017   Mixed hyperlipidemia 06/15/2017   Anxiety and depression 06/15/2017   Genital herpes simplex 06/15/2017   Prediabetes 08/08/2018   Stage 3a chronic kidney disease 12/10/2018   Resolved Ambulatory Problems    Diagnosis Date Noted   Urinary frequency 06/15/2017   Coughing 11/07/2017   Past Medical History:  Diagnosis Date   High blood pressure    High cholesterol        Review of Systems    see HPI.  Objective:   Physical Exam Vitals reviewed.  Constitutional:      Appearance: Normal appearance.  HENT:     Head: Normocephalic.  Cardiovascular:     Rate and Rhythm: Normal rate and regular rhythm.     Pulses: Normal pulses.  Pulmonary:     Effort: Pulmonary effort is normal.     Breath sounds: Normal breath sounds.  Abdominal:     General: Bowel sounds are normal. There is no distension.     Palpations: Abdomen is soft.     Tenderness: There is no abdominal tenderness. There is no right CVA tenderness, left CVA tenderness or guarding.  Musculoskeletal:     Right lower leg: No edema.     Left lower leg: No edema.     Comments: Pain with twisting and flexion at waist in  left lower back.  No pain with palpation of greater trochanter.  Pain just below the illac crest and gluteus medius. To the left external ROM of hip is painful.  No tenderness to palpation over lumbar spine.  Negative SLR.  Strength 5/5 bilateral ext.  2+ patellar reflexes intact.   Neurological:     General: No focal deficit present.     Mental Status: She is alert and oriented to person, place, and time.  Psychiatric:        Mood and Affect: Mood normal.           Assessment & Plan:  Marland KitchenMarland KitchenMarciana was seen today for back pain.  Diagnoses and all orders for this visit:  Acute left-sided low back pain without sciatica -     POCT URINALYSIS DIP (CLINITEK) -     cyclobenzaprine (FLEXERIL) 10 MG tablet; Take 1 tablet (10 mg total) by mouth 3 (three) times daily as needed for muscle spasms. -     ketorolac (TORADOL) 30 MG/ML injection 30 mg -     Urine Culture  Flu vaccine need -     Flu Vaccine QUAD 36+ mos IM  Hematuria, unspecified type -     Urine Culture   .Marland Kitchen Results for orders placed or performed in visit on 11/12/19  POCT URINALYSIS DIP (CLINITEK)  Result Value Ref Range   Color, UA yellow yellow   Clarity, UA clear clear   Glucose, UA negative negative mg/dL   Bilirubin, UA negative negative   Ketones, POC UA negative negative mg/dL   Spec Grav, UA <=3.428 (A) 1.010 - 1.025   Blood, UA trace-lysed (A) negative   pH, UA 6.5 5.0 - 8.0   POC PROTEIN,UA negative negative, trace   Urobilinogen, UA 0.2 0.2 or 1.0 E.U./dL   Nitrite, UA Negative Negative   Leukocytes, UA Trace (A) Negative   Trace blood and leukocytes. No urinary symptoms. Will culture. Cannot rule out kidney stones but pain in back is much lower than flank pain. Pain is improving with aleve. Push fluids and continue aleve. Shot of toradol given IM today.   Will treat like low back sprain. Suggestion icy hot/tens unit/aleve/massage/tennis ball/exercises. If no improvement or worsening symptoms follow  up.   If no bacteria in urine recheck UA in 2 weeks for resolution of hematuria.

## 2019-11-13 ENCOUNTER — Encounter: Payer: Self-pay | Admitting: Physician Assistant

## 2019-11-14 LAB — URINE CULTURE
MICRO NUMBER:: 10951751
SPECIMEN QUALITY:: ADEQUATE

## 2019-11-14 MED ORDER — CEPHALEXIN 500 MG PO CAPS: 500.0000 mg | ORAL_CAPSULE | Freq: Two times a day (BID) | ORAL | 0 refills | Status: DC

## 2019-11-14 NOTE — Progress Notes (Signed)
Kathy Gutierrez,   Very small bacteria count that we usually do not treat with antibiotic unless symptomatic. In your case if you are symptomatic at all we could consider treatment. How do you feel today?

## 2019-11-14 NOTE — Telephone Encounter (Signed)
Received vm with same information. She wants to know next steps to help pain. Please advise.

## 2019-12-16 ENCOUNTER — Telehealth: Payer: Self-pay | Admitting: Osteopathic Medicine

## 2019-12-16 ENCOUNTER — Encounter: Payer: Self-pay | Admitting: Osteopathic Medicine

## 2019-12-16 DIAGNOSIS — E782 Mixed hyperlipidemia: Secondary | ICD-10-CM

## 2019-12-16 DIAGNOSIS — I10 Essential (primary) hypertension: Secondary | ICD-10-CM

## 2019-12-16 DIAGNOSIS — R7303 Prediabetes: Secondary | ICD-10-CM

## 2019-12-16 DIAGNOSIS — R319 Hematuria, unspecified: Secondary | ICD-10-CM

## 2019-12-16 DIAGNOSIS — Z0000 Encounter for general adult medical examination without abnormal findings: Secondary | ICD-10-CM

## 2019-12-16 NOTE — Telephone Encounter (Signed)
Addressed in My Chart msg.

## 2019-12-16 NOTE — Telephone Encounter (Signed)
Pt called. She has an appointment for a physical on October 21st and would like a lab Order sent down tomorrow October 19th. Thank you.

## 2019-12-16 NOTE — Telephone Encounter (Signed)
All orders pended.

## 2019-12-18 ENCOUNTER — Encounter: Payer: BC Managed Care – PPO | Admitting: Osteopathic Medicine

## 2019-12-18 LAB — LIPID PANEL W/REFLEX DIRECT LDL
Cholesterol: 195 mg/dL (ref <200)
HDL: 56 mg/dL (ref >=50)
LDL Cholesterol (Calc): 119 mg/dL (calc) — ABNORMAL HIGH
Non-HDL Cholesterol (Calc): 139 mg/dL (calc) — ABNORMAL HIGH (ref <130)
Total CHOL/HDL Ratio: 3.5 (calc) (ref <5.0)
Triglycerides: 98 mg/dL (ref <150)

## 2019-12-18 LAB — COMPLETE METABOLIC PANEL WITH GFR
AG Ratio: 1.7 (calc) (ref 1.0–2.5)
ALT: 12 U/L (ref 6–29)
AST: 15 U/L (ref 10–35)
Albumin: 4.2 g/dL (ref 3.6–5.1)
Alkaline phosphatase (APISO): 59 U/L (ref 37–153)
BUN/Creatinine Ratio: 12 (calc) (ref 6–22)
BUN: 12 mg/dL (ref 7–25)
CO2: 30 mmol/L (ref 20–32)
Calcium: 9.6 mg/dL (ref 8.6–10.4)
Chloride: 105 mmol/L (ref 98–110)
Creat: 1.02 mg/dL — ABNORMAL HIGH (ref 0.50–0.99)
GFR, Est African American: 68 mL/min/{1.73_m2} (ref >=60)
GFR, Est Non African American: 59 mL/min/{1.73_m2} — ABNORMAL LOW (ref >=60)
Globulin: 2.5 g/dL (calc) (ref 1.9–3.7)
Glucose, Bld: 94 mg/dL (ref 65–99)
Potassium: 4.2 mmol/L (ref 3.5–5.3)
Sodium: 142 mmol/L (ref 135–146)
Total Bilirubin: 0.5 mg/dL (ref 0.2–1.2)
Total Protein: 6.7 g/dL (ref 6.1–8.1)

## 2019-12-18 LAB — CBC
HCT: 35.6 % (ref 35.0–45.0)
Hemoglobin: 12 g/dL (ref 11.7–15.5)
MCH: 31.4 pg (ref 27.0–33.0)
MCHC: 33.7 g/dL (ref 32.0–36.0)
MCV: 93.2 fL (ref 80.0–100.0)
MPV: 10.9 fL (ref 7.5–12.5)
Platelets: 226 10*3/uL (ref 140–400)
RBC: 3.82 10*6/uL (ref 3.80–5.10)
RDW: 13.6 % (ref 11.0–15.0)
WBC: 5.5 10*3/uL (ref 3.8–10.8)

## 2019-12-18 LAB — URINALYSIS
Bilirubin Urine: NEGATIVE
Glucose, UA: NEGATIVE
Ketones, ur: NEGATIVE
Nitrite: NEGATIVE
Protein, ur: NEGATIVE
Specific Gravity, Urine: 1.017 (ref 1.001–1.03)
pH: 5.5 (ref 5.0–8.0)

## 2019-12-18 LAB — HEMOGLOBIN A1C
Hgb A1c MFr Bld: 5.9 % of total Hgb — ABNORMAL HIGH (ref <5.7)
Mean Plasma Glucose: 123 (calc)
eAG (mmol/L): 6.8 (calc)

## 2019-12-19 ENCOUNTER — Encounter: Payer: Self-pay | Admitting: Osteopathic Medicine

## 2019-12-19 ENCOUNTER — Ambulatory Visit (INDEPENDENT_AMBULATORY_CARE_PROVIDER_SITE_OTHER): Payer: BC Managed Care – PPO | Admitting: Osteopathic Medicine

## 2019-12-19 VITALS — BP 139/82 | HR 63 | Temp 97.7°F | Wt 164.1 lb

## 2019-12-19 DIAGNOSIS — N1831 Chronic kidney disease, stage 3a: Secondary | ICD-10-CM

## 2019-12-19 DIAGNOSIS — R8290 Unspecified abnormal findings in urine: Secondary | ICD-10-CM

## 2019-12-19 DIAGNOSIS — E782 Mixed hyperlipidemia: Secondary | ICD-10-CM

## 2019-12-19 DIAGNOSIS — F419 Anxiety disorder, unspecified: Secondary | ICD-10-CM

## 2019-12-19 DIAGNOSIS — R7303 Prediabetes: Secondary | ICD-10-CM | POA: Diagnosis not present

## 2019-12-19 DIAGNOSIS — I10 Essential (primary) hypertension: Secondary | ICD-10-CM | POA: Diagnosis not present

## 2019-12-19 DIAGNOSIS — Z0000 Encounter for general adult medical examination without abnormal findings: Secondary | ICD-10-CM | POA: Diagnosis not present

## 2019-12-19 DIAGNOSIS — F32A Depression, unspecified: Secondary | ICD-10-CM

## 2019-12-19 MED ORDER — OLMESARTAN MEDOXOMIL 20 MG PO TABS: 20.0000 mg | ORAL_TABLET | Freq: Every day | ORAL | 1 refills | Status: DC

## 2019-12-19 MED ORDER — BUPROPION HCL ER (XL) 300 MG PO TB24: ORAL_TABLET | ORAL | 3 refills | Status: DC

## 2019-12-19 NOTE — Progress Notes (Signed)
Kathy Gutierrez is a 64 y.o. female who presents to  [facility]  today, 12/19/19, seeking care for the following:  . Annual physical . Elevated blood pressure, we will be changing medications today, see assessment/plan.  No chest pain, pressure, headache, vision change, dizziness, shortness of breath.     ASSESSMENT & PLAN with other pertinent findings:  The primary encounter diagnosis was Annual physical exam. Diagnoses of Essential hypertension, Prediabetes, Mixed hyperlipidemia, Stage 3a chronic kidney disease (HCC), Abnormal urinalysis, and Anxiety and depression were also pertinent to this visit.   No results found for this or any previous visit (from the past 24 hour(s)).     Patient Instructions  General Preventive Care  Most recent routine screening labs: see attached.   Blood pressure goal 130/80 or less - above goal!   Tobacco: don't!   Alcohol: responsible moderation is ok for most adults - if you have concerns about your alcohol intake, please talk to me!   Exercise: as tolerated to reduce risk of cardiovascular disease and diabetes. Strength training will also prevent osteoporosis.   Mental health: if need for mental health care (medicines, counseling, other), or concerns about moods, please let me know!   Sexual / Reproductive health: if need for STD testing, or if concerns with libido/pain problems, please let me know!  Advanced Directive: Living Will and/or Healthcare Power of Attorney recommended for all adults, regardless of age or health.  Vaccines  Flu vaccine: for almost everyone, every fall.   Shingles vaccine: done  Pneumonia vaccines: after age 66.  Tetanus booster: every 10 years - due 2027  COVID vaccine: THANKS for getting your vaccine! :)  Cancer screenings   Colon cancer screening: for everyone age 56-75. Due 2027  Breast cancer screening: mammogram due 04/2020  Cervical cancer  screening: Pap at age 80, if normal can discontinue Pap testing.   Lung cancer screening: not needed for non-smokers  Infection screenings  . HIV: recommended screening at least once age 16-65 . Gonorrhea/Chlamydia: screening as needed . Hepatitis C: recommended once for everyone age 75-75 . TB: certain at-risk populations Other . Bone Density Test: recommended for women at age 28 . Eye exam annually or every other year    Orders Placed This Encounter  Procedures  . Urinalysis, microscopic only    Meds ordered this encounter  Medications  . olmesartan (BENICAR) 20 MG tablet    Sig: Take 1 tablet (20 mg total) by mouth daily.    Dispense:  30 tablet    Refill:  1  . buPROPion (WELLBUTRIN XL) 300 MG 24 hr tablet    Sig: TK 1 T PO QAM    Dispense:  90 tablet    Refill:  3     Constitutional:  . VSS, see nurse notes . General Appearance: alert, well-developed, well-nourished, NAD Eyes: Marland Kitchen Normal lids and conjunctive, non-icteric sclera Neck: . No masses, trachea midline . No thyroid enlargement/tenderness/mass appreciated Respiratory: . Normal respiratory effort . Breath sounds normal, no wheeze/rhonchi/rales Cardiovascular: . S1/S2 normal, no murmur/rub/gallop auscultated . No carotid bruit or JVD . No lower extremity edema Gastrointestinal: . Nontender, no masses . No hepatomegaly, no splenomegaly . No hernia appreciated Musculoskeletal:  . Gait normal . No clubbing/cyanosis of digits Neurological: . No cranial nerve deficit on limited exam . Motor and sensation intact and symmetric Psychiatric: . Normal judgment/insight . Normal mood and affect   Follow-up instructions: Return for NURSE VISIT  BP CHECK 2 WEEKS. MONITOR A1C 6 MOS - see [place] sooner if needed! .                                         BP 139/82 (BP Location: Left Arm, Patient Position: Sitting, Cuff Size: Normal)   Pulse 63   Temp 97.7 F (36.5 C) (Oral)    Wt 164 lb 1.9 oz (74.4 kg)   BMI 27.31 kg/m   Current Meds  Medication Sig  . atorvastatin (LIPITOR) 20 MG tablet TAKE 1 TABLET(20 MG) BY MOUTH DAILY  . buPROPion (WELLBUTRIN XL) 300 MG 24 hr tablet TK 1 T PO QAM  . cyclobenzaprine (FLEXERIL) 10 MG tablet Take 1 tablet (10 mg total) by mouth 3 (three) times daily as needed for muscle spasms.  . fluticasone (FLONASE) 50 MCG/ACT nasal spray One spray in each nostril twice a day, use left hand for right nostril, and right hand for left nostril.  . metFORMIN (GLUCOPHAGE XR) 750 MG 24 hr tablet Take 1 tablet (750 mg total) by mouth daily with breakfast.  . Multiple Vitamins-Calcium (ONE-A-DAY WOMENS PO) Take by mouth.  Marland Kitchen omeprazole (PRILOSEC) 40 MG capsule Take 1 capsule (40 mg total) by mouth daily.  . valACYclovir (VALTREX) 500 MG tablet Take 1 tablet (500 mg total) by mouth daily.  . [DISCONTINUED] bisoprolol-hydrochlorothiazide (ZIAC) 5-6.25 MG tablet TAKE 1 TABLET BY MOUTH DAILY  . [DISCONTINUED] buPROPion (WELLBUTRIN XL) 300 MG 24 hr tablet TK 1 T PO QAM  . [DISCONTINUED] cephALEXin (KEFLEX) 500 MG capsule Take 1 capsule (500 mg total) by mouth 2 (two) times daily. For 7 days.    Results for orders placed or performed in visit on 12/16/19 (from the past 72 hour(s))  Urinalysis     Status: Abnormal   Collection Time: 12/17/19 10:23 AM  Result Value Ref Range   Color, Urine YELLOW YELLOW   APPearance CLEAR CLEAR   Specific Gravity, Urine 1.017 1.001 - 1.03   pH 5.5 5.0 - 8.0   Glucose, UA NEGATIVE NEGATIVE   Bilirubin Urine NEGATIVE NEGATIVE   Ketones, ur NEGATIVE NEGATIVE   Hgb urine dipstick TRACE (A) NEGATIVE   Protein, ur NEGATIVE NEGATIVE   Nitrite NEGATIVE NEGATIVE   Leukocytes,Ua 2+ (A) NEGATIVE  Lipid Panel w/reflex Direct LDL     Status: Abnormal   Collection Time: 12/17/19 10:23 AM  Result Value Ref Range   Cholesterol 195 <200 mg/dL   HDL 56 > OR = 50 mg/dL   Triglycerides 98 <989 mg/dL   LDL Cholesterol (Calc)  119 (H) mg/dL (calc)    Comment: Reference range: <100 . Desirable range <100 mg/dL for primary prevention;   <70 mg/dL for patients with CHD or diabetic patients  with > or = 2 CHD risk factors. Marland Kitchen LDL-C is now calculated using the Martin-Hopkins  calculation, which is a validated novel method providing  better accuracy than the Friedewald equation in the  estimation of LDL-C.  Horald Pollen et al. Lenox Ahr. 2119;417(40): 2061-2068  (http://education.[facility].com/faq/FAQ164)    Total CHOL/HDL Ratio 3.5 <5.0 (calc)   Non-HDL Cholesterol (Calc) 139 (H) <130 mg/dL (calc)    Comment: For patients with diabetes plus 1 major ASCVD risk  factor, treating to a non-HDL-C goal of <100 mg/dL  (LDL-C of <81 mg/dL) is considered a therapeutic  option.   HgB A1c     Status: Abnormal  Collection Time: 12/17/19 10:23 AM  Result Value Ref Range   Hgb A1c MFr Bld 5.9 (H) <5.7 % of total Hgb    Comment: For someone without known diabetes, a hemoglobin  A1c value between 5.7% and 6.4% is consistent with prediabetes and should be confirmed with a  follow-up test. . For someone with known diabetes, a value <7% indicates that their diabetes is well controlled. A1c targets should be individualized based on duration of diabetes, age, comorbid conditions, and other considerations. . This assay result is consistent with an increased risk of diabetes. . Currently, no consensus exists regarding use of hemoglobin A1c for diagnosis of diabetes for children. .    Mean Plasma Glucose 123 (calc)   eAG (mmol/L) 6.8 (calc)  COMPLETE METABOLIC PANEL WITH GFR     Status: Abnormal   Collection Time: 12/17/19 10:23 AM  Result Value Ref Range   Glucose, Bld 94 65 - 99 mg/dL    Comment: .            Fasting reference interval .    BUN 12 7 - 25 mg/dL   Creat 7.40 (H) 8.14 - 0.99 mg/dL    Comment: For patients >27 years of age, the reference limit for Creatinine is approximately 13% higher for  people identified as African-American. .    GFR, Est Non African American 59 (L) > OR = 60 mL/min/1.25m2   GFR, Est African American 68 > OR = 60 mL/min/1.110m2   BUN/Creatinine Ratio 12 6 - 22 (calc)   Sodium 142 135 - 146 mmol/L   Potassium 4.2 3.5 - 5.3 mmol/L   Chloride 105 98 - 110 mmol/L   CO2 30 20 - 32 mmol/L   Calcium 9.6 8.6 - 10.4 mg/dL   Total Protein 6.7 6.1 - 8.1 g/dL   Albumin 4.2 3.6 - 5.1 g/dL   Globulin 2.5 1.9 - 3.7 g/dL (calc)   AG Ratio 1.7 1.0 - 2.5 (calc)   Total Bilirubin 0.5 0.2 - 1.2 mg/dL   Alkaline phosphatase (APISO) 59 37 - 153 U/L   AST 15 10 - 35 U/L   ALT 12 6 - 29 U/L  CBC     Status: None   Collection Time: 12/17/19 10:23 AM  Result Value Ref Range   WBC 5.5 3.8 - 10.8 Thousand/uL   RBC 3.82 3.80 - 5.10 Million/uL   Hemoglobin 12.0 11.7 - 15.5 g/dL   HCT 48.1 35 - 45 %   MCV 93.2 80.0 - 100.0 fL   MCH 31.4 27.0 - 33.0 pg   MCHC 33.7 32.0 - 36.0 g/dL   RDW 85.6 31.4 - 97.0 %   Platelets 226 140 - 400 Thousand/uL   MPV 10.9 7.5 - 12.5 fL    No results found.     All questions at time of visit were answered - patient instructed to contact office with any additional concerns or updates.  ER/RTC precautions were reviewed with the patient as applicable.   Please note: voice recognition software was used to produce this document, and typos may escape review. Please contact Dr. Lyn Hollingshead for any needed clarifications.

## 2019-12-19 NOTE — Patient Instructions (Signed)
General Preventive Care  Most recent routine screening labs: see attached.   Blood pressure goal 130/80 or less - above goal!   Tobacco: don't!   Alcohol: responsible moderation is ok for most adults - if you have concerns about your alcohol intake, please talk to me!   Exercise: as tolerated to reduce risk of cardiovascular disease and diabetes. Strength training will also prevent osteoporosis.   Mental health: if need for mental health care (medicines, counseling, other), or concerns about moods, please let me know!   Sexual / Reproductive health: if need for STD testing, or if concerns with libido/pain problems, please let me know!  Advanced Directive: Living Will and/or Healthcare Power of Attorney recommended for all adults, regardless of age or health.  Vaccines  Flu vaccine: for almost everyone, every fall.   Shingles vaccine: done  Pneumonia vaccines: after age 61.  Tetanus booster: every 10 years - due 2027  COVID vaccine: THANKS for getting your vaccine! :)  Cancer screenings   Colon cancer screening: for everyone age 15-75. Due 2027  Breast cancer screening: mammogram due 04/2020  Cervical cancer screening: Pap at age 25, if normal can discontinue Pap testing.   Lung cancer screening: not needed for non-smokers  Infection screenings  . HIV: recommended screening at least once age 75-65 . Gonorrhea/Chlamydia: screening as needed . Hepatitis C: recommended once for everyone age 62-75 . TB: certain at-risk populations Other . Bone Density Test: recommended for women at age 80 . Eye exam annually or every other year

## 2019-12-20 LAB — URINALYSIS, MICROSCOPIC ONLY
Bacteria, UA: NONE SEEN /HPF
Hyaline Cast: NONE SEEN /LPF
RBC / HPF: NONE SEEN /HPF (ref 0–2)
Squamous Epithelial / HPF: NONE SEEN /HPF (ref <=5)

## 2019-12-20 MED ORDER — PANTOPRAZOLE SODIUM 40 MG PO TBEC: 40.0000 mg | DELAYED_RELEASE_TABLET | Freq: Every day | ORAL | 3 refills | Status: DC

## 2020-01-02 ENCOUNTER — Ambulatory Visit: Payer: BC Managed Care – PPO

## 2020-01-07 ENCOUNTER — Ambulatory Visit (INDEPENDENT_AMBULATORY_CARE_PROVIDER_SITE_OTHER): Payer: BC Managed Care – PPO | Admitting: Osteopathic Medicine

## 2020-01-07 ENCOUNTER — Other Ambulatory Visit: Payer: Self-pay

## 2020-01-07 VITALS — BP 126/76 | HR 89 | Ht 65.0 in | Wt 160.0 lb

## 2020-01-07 DIAGNOSIS — I10 Essential (primary) hypertension: Secondary | ICD-10-CM

## 2020-01-07 NOTE — Progress Notes (Signed)
   Subjective:    Patient ID: Kathy Gutierrez, female    DOB: [birth year], 64 y.o.   MRN: [id number]  HPI Patient is here for blood pressure check. Denies trouble sleeping, palpitations, or medication problems. Denies any swelling or complications with new medication.     Review of Systems     Objective:   Physical Exam        Assessment & Plan:  First blood pressure reading today was 132/78, pulse was 95. Patient was advised to rest for 5 - 10 minutes prior to re-check to see if BP or pulse comes down any.   Recheck on BP was 126/76, pulse came down to 89. Patient advised to keep on same mediation and keep follow up in April unless any needs arise prior.

## 2020-01-18 ENCOUNTER — Other Ambulatory Visit: Payer: Self-pay | Admitting: Osteopathic Medicine

## 2020-03-06 ENCOUNTER — Other Ambulatory Visit: Payer: Self-pay | Admitting: Osteopathic Medicine

## 2020-03-06 DIAGNOSIS — E782 Mixed hyperlipidemia: Secondary | ICD-10-CM

## 2020-04-28 ENCOUNTER — Encounter: Payer: Self-pay | Admitting: Osteopathic Medicine

## 2020-04-30 MED ORDER — METFORMIN HCL ER 500 MG PO TB24
500.0000 mg | ORAL_TABLET | Freq: Every day | ORAL | 0 refills | Status: DC
Start: 2020-04-30 — End: 2020-06-11

## 2020-05-18 ENCOUNTER — Other Ambulatory Visit: Payer: Self-pay | Admitting: *Deleted

## 2020-05-18 ENCOUNTER — Encounter: Payer: Self-pay | Admitting: Osteopathic Medicine

## 2020-05-18 ENCOUNTER — Telehealth: Payer: Self-pay | Admitting: Osteopathic Medicine

## 2020-05-18 MED ORDER — OLMESARTAN MEDOXOMIL 20 MG PO TABS
ORAL_TABLET | ORAL | 0 refills | Status: DC
Start: 2020-05-18 — End: 2020-06-11

## 2020-05-18 NOTE — Telephone Encounter (Signed)
Medication refill and pt notified through mychart.

## 2020-05-18 NOTE — Telephone Encounter (Signed)
Patient called and needs her Rx called in as soon as possible   olmesartan (BENICAR) 20 MG tablet

## 2020-06-11 ENCOUNTER — Encounter: Payer: Self-pay | Admitting: Osteopathic Medicine

## 2020-06-11 ENCOUNTER — Other Ambulatory Visit: Payer: Self-pay

## 2020-06-11 ENCOUNTER — Ambulatory Visit (INDEPENDENT_AMBULATORY_CARE_PROVIDER_SITE_OTHER): Payer: BC Managed Care – PPO | Admitting: Osteopathic Medicine

## 2020-06-11 VITALS — BP 123/78 | HR 78 | Temp 97.8°F | Wt 164.0 lb

## 2020-06-11 DIAGNOSIS — A6000 Herpesviral infection of urogenital system, unspecified: Secondary | ICD-10-CM | POA: Diagnosis not present

## 2020-06-11 DIAGNOSIS — R7303 Prediabetes: Secondary | ICD-10-CM | POA: Diagnosis not present

## 2020-06-11 DIAGNOSIS — I10 Essential (primary) hypertension: Secondary | ICD-10-CM

## 2020-06-11 LAB — POCT GLYCOSYLATED HEMOGLOBIN (HGB A1C): Hemoglobin A1C: 5.7 % — AB (ref 4.0–5.6)

## 2020-06-11 MED ORDER — OLMESARTAN MEDOXOMIL 20 MG PO TABS
ORAL_TABLET | ORAL | 3 refills | Status: DC
Start: 2020-06-11 — End: 2021-03-16

## 2020-06-11 MED ORDER — VALACYCLOVIR HCL 500 MG PO TABS: 500.0000 mg | ORAL_TABLET | Freq: Every day | ORAL | 3 refills | Status: DC

## 2020-06-11 MED ORDER — METFORMIN HCL ER 500 MG PO TB24
500.0000 mg | ORAL_TABLET | Freq: Every day | ORAL | 3 refills | Status: DC
Start: 2020-06-11 — End: 2021-03-16

## 2020-06-11 NOTE — Progress Notes (Signed)
Kathy Gutierrez is a 65 y.o. female who presents to  [facility] at [facility]  today, 06/11/20, seeking care for the following:  Marland Kitchen Monitor A1C / prediabetes. Today 5.7. About 6 mos ago was 5.9. has ranged 5.8-6.1 since 05/2017    ASSESSMENT & PLAN with other pertinent findings:  The primary encounter diagnosis was Prediabetes. Diagnoses of Genital herpes simplex, unspecified site and Essential hypertension were also pertinent to this visit.   1. Prediabetes Well controlled Continue current Rx Recheck A1C w/ other annual labs in 6 mos   2. Genital herpes simplex, unspecified site 3. Essential hypertension Stable Refilled Rx    There are no Patient Instructions on file for this visit.  Orders Placed This Encounter  Procedures  . POCT HgB A1C    Meds ordered this encounter  Medications  . metFORMIN (GLUCOPHAGE XR) 500 MG 24 hr tablet    Sig: Take 1 tablet (500 mg total) by mouth daily with breakfast.    Dispense:  90 tablet    Refill:  3  . olmesartan (BENICAR) 20 MG tablet    Sig: TAKE 1 TABLET(20 MG) BY MOUTH DAILY    Dispense:  90 tablet    Refill:  3  . valACYclovir (VALTREX) 500 MG tablet    Sig: Take 1 tablet (500 mg total) by mouth daily.    Dispense:  90 tablet    Refill:  3     See below for relevant physical exam findings  See below for recent lab and imaging results reviewed  Medications, allergies, PMH, PSH, SocH, FamH reviewed below    Follow-up instructions: Return in about 6 months (around 12/11/2020) for [place] (call week prior to visit for lab orders).                                        Exam:  BP 123/78 (BP Location: Left Arm, Patient Position: Sitting, Cuff Size: Normal)   Pulse 78   Temp 97.8 F (36.6 C) (Oral)   Wt 164 lb 0.6 oz (74.4 kg)   BMI 27.30 kg/m   Constitutional: VS see above. General Appearance: alert, well-developed, well-nourished,  NAD  Neck: No masses, trachea midline.   Respiratory: Normal respiratory effort. no wheeze, no rhonchi, no rales  Cardiovascular: S1/S2 normal, no murmur, no rub/gallop auscultated. RRR.   Musculoskeletal: Gait normal. Symmetric and independent movement of all extremities  Neurological: Normal balance/coordination. No tremor.  Skin: warm, dry, intact.   Psychiatric: Normal judgment/insight. Normal mood and affect. Oriented x3.   Current Meds  Medication Sig  . atorvastatin (LIPITOR) 20 MG tablet TAKE 1 TABLET(20 MG) BY MOUTH DAILY  . buPROPion (WELLBUTRIN XL) 300 MG 24 hr tablet TK 1 T PO QAM  . Multiple Vitamins-Calcium (ONE-A-DAY WOMENS PO) Take by mouth.  . pantoprazole (PROTONIX) 40 MG tablet Take 1 tablet (40 mg total) by mouth daily.  . [DISCONTINUED] metFORMIN (GLUCOPHAGE XR) 500 MG 24 hr tablet Take 1 tablet (500 mg total) by mouth daily with breakfast.  . [DISCONTINUED] olmesartan (BENICAR) 20 MG tablet TAKE 1 TABLET(20 MG) BY MOUTH DAILY  . [DISCONTINUED] valACYclovir (VALTREX) 500 MG tablet Take 1 tablet (500 mg total) by mouth daily.    No Known Allergies  Patient Active Problem List   Diagnosis Date Noted  . Stage 3a chronic kidney disease (HCC) 12/10/2018  . Prediabetes 08/08/2018  .  Essential hypertension 06/15/2017  . Gastroesophageal reflux disease 06/15/2017  . Mixed hyperlipidemia 06/15/2017  . Anxiety and depression 06/15/2017  . Genital herpes simplex 06/15/2017    Family History  Problem Relation Age of Onset  . Diabetes Mother   . High Cholesterol Mother   . High blood pressure Mother   . Aortic aneurysm Mother   . High blood pressure Father   . Anuerysm Sister   . Multiple sclerosis Sister     Social History   Tobacco Use  Smoking Status Never Smoker  Smokeless Tobacco Never Used    History reviewed. No pertinent surgical history.  Immunization History  Administered Date(s) Administered  . Influenza Inj Mdck Quad Pf 12/11/2016  .  Influenza, Seasonal, Injecte, Preservative Fre 11/23/2017  . Influenza,inj,Quad PF,6+ Mos 11/23/2017, 11/12/2019  . Influenza-Unspecified 12/07/2018  . Moderna Sars-Covid-2 Vaccination 04/27/2019, 05/25/2019, 12/31/2019  . Td 10/05/1992  . Tdap 11/25/2015  . Zoster 11/13/2012  . Zoster Recombinat (Shingrix) 03/13/2017    Recent Results (from the past 2160 hour(s))  POCT HgB A1C     Status: Abnormal   Collection Time: 06/11/20  8:12 AM  Result Value Ref Range   Hemoglobin A1C 5.7 (A) 4.0 - 5.6 %   HbA1c POC (<> result, manual entry)     HbA1c, POC (prediabetic range)     HbA1c, POC (controlled diabetic range)      No results found.     All questions at time of visit were answered - patient instructed to contact office with any additional concerns or updates. ER/RTC precautions were reviewed with the patient as applicable.   Please note: manual typing as well as voice recognition software may have been used to produce this document - typos may escape review. Please contact Dr. Lyn Hollingshead for any needed clarifications.

## 2020-06-18 ENCOUNTER — Ambulatory Visit: Payer: BC Managed Care – PPO | Admitting: Osteopathic Medicine

## 2020-07-01 ENCOUNTER — Other Ambulatory Visit: Payer: Self-pay | Admitting: Osteopathic Medicine

## 2020-07-01 DIAGNOSIS — A6000 Herpesviral infection of urogenital system, unspecified: Secondary | ICD-10-CM

## 2020-08-13 ENCOUNTER — Encounter: Payer: Self-pay | Admitting: Osteopathic Medicine

## 2020-08-13 ENCOUNTER — Other Ambulatory Visit: Payer: Self-pay | Admitting: Osteopathic Medicine

## 2020-09-24 ENCOUNTER — Encounter: Payer: Self-pay | Admitting: Family Medicine

## 2020-09-24 ENCOUNTER — Ambulatory Visit: Payer: BC Managed Care – PPO | Admitting: Family Medicine

## 2020-09-24 ENCOUNTER — Other Ambulatory Visit: Payer: Self-pay

## 2020-09-24 VITALS — BP 130/83 | HR 79 | Ht 65.0 in | Wt 160.0 lb

## 2020-09-24 DIAGNOSIS — Z87898 Personal history of other specified conditions: Secondary | ICD-10-CM | POA: Diagnosis not present

## 2020-09-24 DIAGNOSIS — R002 Palpitations: Secondary | ICD-10-CM | POA: Diagnosis not present

## 2020-09-24 DIAGNOSIS — R7309 Other abnormal glucose: Secondary | ICD-10-CM | POA: Diagnosis not present

## 2020-09-24 NOTE — Progress Notes (Signed)
Acute Office Visit  Subjective:    Patient ID: Kathy Gutierrez, female    DOB: 1955-03-11, 65 y.o.   MRN: [id number]  Chief Complaint  Patient presents with   Weight Loss    HPI Patient is in today for weight loss and palpitations.  WEIGHT LOSS: Started metformin within the past year and initially lost about 5-6 pounds. Feels like she can see it/feel it in her clothes that she is losing weight. Trying to eat several small meals a day and being mindful given dx of prediabetes.She has been walking about 20 minutes a day.  She has lost about 5 pounds in the past year.  A1c  05/2020 was 5.7  PALPITATIONS: A few weeks ago, patient noticed a couple episodes of palpitations. States she has had this with anxiety in the past, but she cannot think of anything she has been anxious about. Episodes were brief and only occurred a few times over the course of two weeks. She decided to cut the amount of caffeine she has per day in half (now drinking one cup of half-caffeine with half-decaf coffee per day). Since cutting the caffeine back, she has not had any more episodes of palpitations.    She denies any chest pain, fatigue, SOB/DOE, headaches, abdominal pain, blood in urine/stool, heat/cold intolerance, changes to hair/skin/nails, polydipsia, polyuria.      Past Medical History:  Diagnosis Date   High blood pressure    High cholesterol     No past surgical history on file.  Family History  Problem Relation Age of Onset   Diabetes Mother    High Cholesterol Mother    High blood pressure Mother    Aortic aneurysm Mother    High blood pressure Father    Anuerysm Sister    Multiple sclerosis Sister     Social History   Socioeconomic History   Marital status: Divorced    Spouse name: Not on file   Number of children: 1   Years of education: Not on file   Highest education level: Master's degree (e.g., MA, MS, MEng, MEd, MSW, MBA)  Occupational History   Occupation: Games developer: [employer]  Tobacco Use   Smoking status: Never   Smokeless tobacco: Never  Vaping Use   Vaping Use: Never used  Substance and Sexual Activity   Alcohol use: Not Currently   Drug use: Never   Sexual activity: Yes    Birth control/protection: None  Other Topics Concern   Not on file  Social History Narrative   Not on file   Social Determinants of Health   Financial Resource Strain: Not on file  Food Insecurity: Not on file  Transportation Needs: Not on file  Physical Activity: Not on file  Stress: Not on file  Social Connections: Not on file  Intimate Partner Violence: Not on file    Outpatient Medications Prior to Visit  Medication Sig Dispense Refill   atorvastatin (LIPITOR) 20 MG tablet TAKE 1 TABLET(20 MG) BY MOUTH DAILY 90 tablet 3   buPROPion (WELLBUTRIN XL) 300 MG 24 hr tablet TK 1 T PO QAM 90 tablet 3   metFORMIN (GLUCOPHAGE XR) 500 MG 24 hr tablet Take 1 tablet (500 mg total) by mouth daily with breakfast. 90 tablet 3   Multiple Vitamins-Calcium (ONE-A-DAY WOMENS PO) Take by mouth.     olmesartan (BENICAR) 20 MG tablet TAKE 1 TABLET(20 MG) BY MOUTH DAILY 90 tablet 3   pantoprazole (PROTONIX) 40 MG  tablet Take 1 tablet (40 mg total) by mouth daily. 90 tablet 3   valACYclovir (VALTREX) 500 MG tablet Take 1 tablet (500 mg total) by mouth daily. 90 tablet 3   cyclobenzaprine (FLEXERIL) 10 MG tablet Take 1 tablet (10 mg total) by mouth 3 (three) times daily as needed for muscle spasms. (Patient not taking: Reported on 06/11/2020) 30 tablet 0   fluticasone (FLONASE) 50 MCG/ACT nasal spray One spray in each nostril twice a day, use left hand for right nostril, and right hand for left nostril. (Patient not taking: Reported on 06/11/2020) 48 g 3   No facility-administered medications prior to visit.    No Known Allergies  Review of Systems All review of systems negative except what is listed in the HPI     Objective:    Physical Exam Vitals reviewed.   Constitutional:      Appearance: Normal appearance.  HENT:     Head: Normocephalic and atraumatic.  Cardiovascular:     Rate and Rhythm: Normal rate and regular rhythm.     Pulses: Normal pulses.     Heart sounds: Normal heart sounds.  Pulmonary:     Effort: Pulmonary effort is normal.     Breath sounds: Normal breath sounds.  Musculoskeletal:        General: Normal range of motion.     Cervical back: Normal range of motion and neck supple.  Lymphadenopathy:     Cervical: No cervical adenopathy.  Skin:    General: Skin is warm and dry.     Capillary Refill: Capillary refill takes less than 2 seconds.  Neurological:     General: No focal deficit present.     Mental Status: She is alert and oriented to person, place, and time.  Psychiatric:        Mood and Affect: Mood normal.        Behavior: Behavior normal.        Thought Content: Thought content normal.        Judgment: Judgment normal.      There were no vitals taken for this visit. Wt Readings from Last 3 Encounters:  06/11/20 164 lb 0.6 oz (74.4 kg)  01/07/20 160 lb (72.6 kg)  12/19/19 164 lb 1.9 oz (74.4 kg)    Health Maintenance Due  Topic Date Due   Zoster Vaccines- Shingrix (2 of 2) 05/08/2017   COVID-19 Vaccine (4 - Booster for Moderna series) 04/29/2020    There are no preventive care reminders to display for this patient.   Lab Results  Component Value Date   TSH 1.46 08/08/2018   Lab Results  Component Value Date   WBC 5.5 12/17/2019   HGB 12.0 12/17/2019   HCT 35.6 12/17/2019   MCV 93.2 12/17/2019   PLT 226 12/17/2019   Lab Results  Component Value Date   NA 142 12/17/2019   K 4.2 12/17/2019   CO2 30 12/17/2019   GLUCOSE 94 12/17/2019   BUN 12 12/17/2019   CREATININE 1.02 (H) 12/17/2019   BILITOT 0.5 12/17/2019   AST 15 12/17/2019   ALT 12 12/17/2019   PROT 6.7 12/17/2019   CALCIUM 9.6 12/17/2019   Lab Results  Component Value Date   CHOL 195 12/17/2019   Lab Results   Component Value Date   HDL 56 12/17/2019   Lab Results  Component Value Date   LDLCALC 119 (H) 12/17/2019   Lab Results  Component Value Date   TRIG 98 12/17/2019  Lab Results  Component Value Date   CHOLHDL 3.5 12/17/2019   Lab Results  Component Value Date   HGBA1C 5.7 (A) 06/11/2020       Assessment & Plan:   1. Palpitations EKG - NSR 83, inverted T waves in V1, V3 Given that she has not had any chest pain or other concerning symptoms and noting that palpitations improved with caffeine reduction, will check routine labs today and have her follow-up with PCP in 1-2 months for repeat EKG. Patient is agreeable. Discussed with PCP. Patient aware of signs/symptoms requiring further/urgent evaluation.  - EKG 12-Lead - TSH - CBC - COMPLETE METABOLIC PANEL WITH GFR  2. Elevated hemoglobin A1c  - Hemoglobin A1c  3. Weight loss She has not lost a significant amount of weight. Educated patient that her diet changes and increase in exercise have likely contributed. Will go ahead and check labs since we are looking into her palpitation causes anyway. No other alarm findings today. Continue to monitor.    Follow-up with PCP in 1-2 months.   Lollie Marrow Reola Calkins, DNP, FNP-C

## 2020-09-24 NOTE — Patient Instructions (Addendum)
Continue to limit caffeine.  Labs today Continue to monitor your weight Continue healthy diet and exercise

## 2020-09-25 ENCOUNTER — Encounter: Payer: Self-pay | Admitting: Osteopathic Medicine

## 2020-09-25 LAB — CBC
HCT: 35.5 % (ref 35.0–45.0)
Hemoglobin: 11.6 g/dL — ABNORMAL LOW (ref 11.7–15.5)
MCH: 30.4 pg (ref 27.0–33.0)
MCHC: 32.7 g/dL (ref 32.0–36.0)
MCV: 93.2 fL (ref 80.0–100.0)
MPV: 10.7 fL (ref 7.5–12.5)
Platelets: 255 10*3/uL (ref 140–400)
RBC: 3.81 10*6/uL (ref 3.80–5.10)
RDW: 13.4 % (ref 11.0–15.0)
WBC: 5.2 10*3/uL (ref 3.8–10.8)

## 2020-09-25 LAB — COMPLETE METABOLIC PANEL WITH GFR
AG Ratio: 1.6 (calc) (ref 1.0–2.5)
ALT: 11 U/L (ref 6–29)
AST: 14 U/L (ref 10–35)
Albumin: 4.2 g/dL (ref 3.6–5.1)
Alkaline phosphatase (APISO): 72 U/L (ref 37–153)
BUN: 9 mg/dL (ref 7–25)
CO2: 29 mmol/L (ref 20–32)
Calcium: 9.9 mg/dL (ref 8.6–10.4)
Chloride: 106 mmol/L (ref 98–110)
Creat: 1.04 mg/dL (ref 0.50–1.05)
Globulin: 2.7 g/dL (calc) (ref 1.9–3.7)
Glucose, Bld: 92 mg/dL (ref 65–99)
Potassium: 4.2 mmol/L (ref 3.5–5.3)
Sodium: 142 mmol/L (ref 135–146)
Total Bilirubin: 0.4 mg/dL (ref 0.2–1.2)
Total Protein: 6.9 g/dL (ref 6.1–8.1)
eGFR: 60 mL/min/{1.73_m2} (ref >=60)

## 2020-09-25 LAB — HEMOGLOBIN A1C
Hgb A1c MFr Bld: 5.8 % of total Hgb — ABNORMAL HIGH (ref <5.7)
Mean Plasma Glucose: 120 mg/dL
eAG (mmol/L): 6.6 mmol/L

## 2020-09-25 LAB — TSH: TSH: 1.57 mIU/L (ref 0.40–4.50)

## 2020-09-25 NOTE — Progress Notes (Signed)
MyChart message sent: Your labs look good overall. A1c is still in the prediabetic range, but stable from where you have been over the past year.

## 2020-11-10 ENCOUNTER — Encounter: Payer: Self-pay | Admitting: Osteopathic Medicine

## 2020-11-30 ENCOUNTER — Other Ambulatory Visit: Payer: Self-pay | Admitting: Osteopathic Medicine

## 2020-11-30 DIAGNOSIS — E782 Mixed hyperlipidemia: Secondary | ICD-10-CM

## 2020-12-03 ENCOUNTER — Ambulatory Visit: Payer: BC Managed Care – PPO | Admitting: Family Medicine

## 2020-12-03 ENCOUNTER — Encounter: Payer: Self-pay | Admitting: Family Medicine

## 2020-12-03 VITALS — BP 151/99 | HR 99 | Temp 98.1°F | Wt 159.0 lb

## 2020-12-03 DIAGNOSIS — R350 Frequency of micturition: Secondary | ICD-10-CM | POA: Diagnosis not present

## 2020-12-03 DIAGNOSIS — Z23 Encounter for immunization: Secondary | ICD-10-CM | POA: Diagnosis not present

## 2020-12-03 LAB — POCT URINALYSIS DIP (CLINITEK)
Bilirubin, UA: NEGATIVE
Glucose, UA: NEGATIVE mg/dL
Ketones, POC UA: NEGATIVE mg/dL
Leukocytes, UA: NEGATIVE
Nitrite, UA: NEGATIVE
POC PROTEIN,UA: NEGATIVE
Spec Grav, UA: <=1.005 — AB (ref 1.010–1.025)
Urobilinogen, UA: 0.2 E.U./dL
pH, UA: 6.5 (ref 5.0–8.0)

## 2020-12-03 NOTE — Patient Instructions (Signed)
Sending urine for culture Blood work to check your blood sugar and kidney function We will let you know results and any change we need to make

## 2020-12-03 NOTE — Progress Notes (Signed)
Acute Office Visit  Subjective:    Patient ID: Kathy Gutierrez, female    DOB: [birth year], 65 y.o.   MRN: [id number]  Chief Complaint  Patient presents with   Urinary Tract Infection    Urinary Tract Infection  This is a new problem. The current episode started in the past 7 days. The problem has been unchanged. Quality: slight perineal discomfort a few days ago; slight lower left back pain 5/10 yesterday. The patient is experiencing no pain. There has been no fever. She is Not sexually active. Associated symptoms include flank pain, frequency and urgency. Pertinent negatives include no chills, discharge, hematuria, hesitancy, nausea, possible pregnancy, sweats or vomiting. She has tried nothing for the symptoms. No true dysuria or pain. She drinks about 60-80 ounces of water per day at baseline. History of microscopic hematuria. No increase in thirst, hunger, hypoglycemic events, headaches, vision changes, chest pain. No pain today.   Past Medical History:  Diagnosis Date   High blood pressure    High cholesterol     History reviewed. No pertinent surgical history.  Family History  Problem Relation Age of Onset   Diabetes Mother    High Cholesterol Mother    High blood pressure Mother    Aortic aneurysm Mother    High blood pressure Father    Anuerysm Sister    Multiple sclerosis Sister     Social History   Socioeconomic History   Marital status: Divorced    Spouse name: Not on file   Number of children: 1   Years of education: Not on file   Highest education level: Master's degree (e.g., MA, MS, MEng, MEd, MSW, MBA)  Occupational History   Occupation: Fish farm manager: [employer]  Tobacco Use   Smoking status: Never   Smokeless tobacco: Never  Vaping Use   Vaping Use: Never used  Substance and Sexual Activity   Alcohol use: Not Currently   Drug use: Never   Sexual activity: Yes    Birth control/protection: None  Other Topics Concern   Not on file   Social History Narrative   Not on file   Social Determinants of Health   Financial Resource Strain: Not on file  Food Insecurity: Not on file  Transportation Needs: Not on file  Physical Activity: Not on file  Stress: Not on file  Social Connections: Not on file  Intimate Partner Violence: Not on file    Outpatient Medications Prior to Visit  Medication Sig Dispense Refill   atorvastatin (LIPITOR) 20 MG tablet TAKE 1 TABLET(20 MG) BY MOUTH DAILY 90 tablet 1   buPROPion (WELLBUTRIN XL) 300 MG 24 hr tablet TK 1 T PO QAM 90 tablet 3   metFORMIN (GLUCOPHAGE XR) 500 MG 24 hr tablet Take 1 tablet (500 mg total) by mouth daily with breakfast. 90 tablet 3   Multiple Vitamins-Calcium (ONE-A-DAY WOMENS PO) Take by mouth.     olmesartan (BENICAR) 20 MG tablet TAKE 1 TABLET(20 MG) BY MOUTH DAILY 90 tablet 3   pantoprazole (PROTONIX) 40 MG tablet Take 1 tablet (40 mg total) by mouth daily. 90 tablet 3   valACYclovir (VALTREX) 500 MG tablet Take 1 tablet (500 mg total) by mouth daily. 90 tablet 3   No facility-administered medications prior to visit.    No Known Allergies  Review of Symptoms All review of systems negative except what is listed in the HPI      Objective:    Physical Exam Vitals reviewed.  Constitutional:      Appearance: Normal appearance.  HENT:     Head: Normocephalic and atraumatic.  Cardiovascular:     Rate and Rhythm: Normal rate and regular rhythm.     Heart sounds: Normal heart sounds.  Pulmonary:     Effort: Pulmonary effort is normal.     Breath sounds: Normal breath sounds.  Abdominal:     General: Bowel sounds are normal. There is no distension.     Palpations: Abdomen is soft. There is no mass.     Tenderness: There is no abdominal tenderness. There is no right CVA tenderness, left CVA tenderness, guarding or rebound.  Neurological:     Mental Status: She is alert and oriented to person, place, and time.  Psychiatric:        Mood and Affect: Mood  normal.        Behavior: Behavior normal.        Thought Content: Thought content normal.        Judgment: Judgment normal.    BP (!) 151/99 (BP Location: Left Arm, Patient Position: Sitting, Cuff Size: Normal)   Pulse 99   Temp 98.1 F (36.7 C) (Oral)   Wt 159 lb (72.1 kg)   BMI 26.46 kg/m  Wt Readings from Last 3 Encounters:  12/03/20 159 lb (72.1 kg)  09/24/20 160 lb (72.6 kg)  06/11/20 164 lb 0.6 oz (74.4 kg)    There are no preventive care reminders to display for this patient.   There are no preventive care reminders to display for this patient.   Lab Results  Component Value Date   TSH 1.57 09/24/2020   Lab Results  Component Value Date   WBC 5.2 09/24/2020   HGB 11.6 (L) 09/24/2020   HCT 35.5 09/24/2020   MCV 93.2 09/24/2020   PLT 255 09/24/2020   Lab Results  Component Value Date   NA 142 09/24/2020   K 4.2 09/24/2020   CO2 29 09/24/2020   GLUCOSE 92 09/24/2020   BUN 9 09/24/2020   CREATININE 1.04 09/24/2020   BILITOT 0.4 09/24/2020   AST 14 09/24/2020   ALT 11 09/24/2020   PROT 6.9 09/24/2020   CALCIUM 9.9 09/24/2020   EGFR 60 09/24/2020   Lab Results  Component Value Date   CHOL 195 12/17/2019   Lab Results  Component Value Date   HDL 56 12/17/2019   Lab Results  Component Value Date   LDLCALC 119 (H) 12/17/2019   Lab Results  Component Value Date   TRIG 98 12/17/2019   Lab Results  Component Value Date   CHOLHDL 3.5 12/17/2019   Lab Results  Component Value Date   HGBA1C 5.8 (H) 09/24/2020       Assessment & Plan:   1. Frequent urination About 2-3 days of increased frequency, one day of L flank pain, now resolved, no other symptoms. UA negative apart from slight microscopic hematuria (history). Will send for microscopic exam and culture. Also checking BMP to assess glucose and kidney function given history. Will update patient with results and any changes to plan. Patient aware of signs/symptoms requiring further/urgent  evaluation.  - POCT URINALYSIS DIP (CLINITEK) - Urinalysis, Routine w reflex microscopic - Urine Culture - BASIC METABOLIC PANEL WITH GFR  2. Need for influenza vaccination - Flu Vaccine QUAD 6+ mos PF IM (Fluarix Quad PF)  Follow-up as needed.   Purcell Nails Olevia Bowens, DNP, FNP-C

## 2020-12-04 LAB — BASIC METABOLIC PANEL WITH GFR
BUN: 11 mg/dL (ref 7–25)
CO2: 29 mmol/L (ref 20–32)
Calcium: 10.4 mg/dL (ref 8.6–10.4)
Chloride: 105 mmol/L (ref 98–110)
Creat: 0.99 mg/dL (ref 0.50–1.05)
Glucose, Bld: 92 mg/dL (ref 65–99)
Potassium: 4.2 mmol/L (ref 3.5–5.3)
Sodium: 142 mmol/L (ref 135–146)
eGFR: 64 mL/min/{1.73_m2} (ref >=60)

## 2020-12-05 LAB — URINALYSIS, ROUTINE W REFLEX MICROSCOPIC
Bilirubin Urine: NEGATIVE
Glucose, UA: NEGATIVE
Hgb urine dipstick: NEGATIVE
Ketones, ur: NEGATIVE
Leukocytes,Ua: NEGATIVE
Nitrite: NEGATIVE
Protein, ur: NEGATIVE
Specific Gravity, Urine: 1.002 (ref 1.001–1.035)
pH: 6.5 (ref 5.0–8.0)

## 2020-12-05 LAB — URINE CULTURE
MICRO NUMBER:: 12472672
Result:: NO GROWTH
SPECIMEN QUALITY:: ADEQUATE

## 2020-12-11 ENCOUNTER — Encounter: Payer: BC Managed Care – PPO | Admitting: Osteopathic Medicine

## 2020-12-11 ENCOUNTER — Encounter: Payer: BC Managed Care – PPO | Admitting: Medical-Surgical

## 2021-01-18 ENCOUNTER — Telehealth: Payer: Self-pay | Admitting: Osteopathic Medicine

## 2021-01-18 MED ORDER — PANTOPRAZOLE SODIUM 40 MG PO TBEC: 40.0000 mg | DELAYED_RELEASE_TABLET | Freq: Every day | ORAL | 0 refills | Status: DC

## 2021-01-18 NOTE — Telephone Encounter (Signed)
Rx renewed

## 2021-01-18 NOTE — Telephone Encounter (Signed)
Pt was a Dr.Alexander patient and scheduled her transfer of care and Physical with Christen Butter for January and needs a refill on her Protonix to [facility] on [address] in [place]

## 2021-02-05 ENCOUNTER — Encounter: Payer: Self-pay | Admitting: Medical-Surgical

## 2021-02-05 LAB — HM DIABETES EYE EXAM

## 2021-03-16 NOTE — Progress Notes (Signed)
HPI: Kathy Gutierrez is a 66 y.o. female who  has a past medical history of Anxiety, GERD (gastroesophageal reflux disease), High blood pressure, and High cholesterol.  she presents to [facility] today, 03/17/21,  for chief complaint of: Annual physical exam Transfer of care  Dentist: UTD, no concern Eye exam: UTD, no concerns, no diabetic retinopathy Exercise: Doing yoga and/or step a couple of times weekly Diet: She has stopped drinking sodas and Desmond Dike, making more conscientious choices Pap smear: UTD Mammogram: Due in March Colon cancer screening: Up-to-date COVID vaccine: Done with 1 booster  Concerns: None  Past medical, surgical, social and family history reviewed:  Patient Active Problem List   Diagnosis Date Noted   Stage 3a chronic kidney disease ([place]) 12/10/2018   Prediabetes 08/08/2018   Essential hypertension 06/15/2017   Gastroesophageal reflux disease 06/15/2017   Mixed hyperlipidemia 06/15/2017   Anxiety and depression 06/15/2017   Genital herpes simplex 06/15/2017    No past surgical history on file.  Social History   Tobacco Use   Smoking status: Never   Smokeless tobacco: Never  Substance Use Topics   Alcohol use: Not Currently    Comment: Maybe a glassnof wine 2 ntimes a month    Family History  Problem Relation Age of Onset   Diabetes Mother    High Cholesterol Mother    High blood pressure Mother    Aortic aneurysm Mother    High blood pressure Father    Anuerysm Sister    Multiple sclerosis Sister      Current medication list and allergy/intolerance information reviewed:    Current Outpatient Medications  Medication Sig Dispense Refill   atorvastatin (LIPITOR) 20 MG tablet TAKE 1 TABLET(20 MG) BY MOUTH DAILY 90 tablet 1   buPROPion (WELLBUTRIN XL) 300 MG 24 hr tablet TK 1 T PO QAM 90 tablet 3   Multiple Vitamins-Calcium (ONE-A-DAY WOMENS PO) Take by mouth.     metFORMIN (GLUCOPHAGE XR) 500  MG 24 hr tablet Take 1 tablet (500 mg total) by mouth daily with breakfast. 90 tablet 3   olmesartan (BENICAR) 20 MG tablet TAKE 1 TABLET(20 MG) BY MOUTH DAILY 90 tablet 3   pantoprazole (PROTONIX) 40 MG tablet Take 1 tablet (40 mg total) by mouth daily. 90 tablet 0   valACYclovir (VALTREX) 500 MG tablet Take 1 tablet (500 mg total) by mouth daily. 90 tablet 3   No current facility-administered medications for this visit.    No Known Allergies    Review of Systems: Constitutional:  No  fever, no chills, No recent illness, No unintentional weight changes. No significant fatigue.  HEENT: No  headache, no vision change, no hearing change, No sore throat, No  sinus pressure Cardiac: No  chest pain, No  pressure, No palpitations, No  Orthopnea Respiratory:  No  shortness of breath. No  Cough Gastrointestinal: No  abdominal pain, No  nausea, No  vomiting,  No  blood in stool, No  diarrhea, No  constipation  Musculoskeletal: No new myalgia/arthralgia Skin: No  Rash, No other wounds/concerning lesions Genitourinary: No  incontinence, No  abnormal genital bleeding, No abnormal genital discharge Hem/Onc: No  easy bruising/bleeding, No  abnormal lymph node Endocrine: No cold intolerance,  No heat intolerance. No polyuria/polydipsia/polyphagia  Neurologic: No  weakness, No  dizziness, No  slurred speech/focal weakness/facial droop Psychiatric: No  concerns with depression, No  concerns with anxiety, No sleep problems, No mood problems  Exam:  BP Marland Kitchen)  163/92    Pulse 94    Resp 20    Ht '5\' 5"'  (1.651 m)    Wt 158 lb (71.7 kg)    SpO2 100%    BMI 26.29 kg/m  Constitutional: VS see above. General Appearance: alert, well-developed, well-nourished, NAD Eyes: Normal lids and conjunctive, non-icteric sclera Ears, Nose, Mouth, Throat: MMM, Normal external inspection ears/nares/mouth/lips/gums. TM normal bilaterally.   Neck: No masses, trachea midline. No thyroid enlargement. No tenderness/mass appreciated.  No lymphadenopathy Respiratory: Normal respiratory effort. no wheeze, no rhonchi, no rales Cardiovascular: S1/S2 normal, no murmur, no rub/gallop auscultated. RRR. No lower extremity edema. Pedal pulse II/IV bilaterally PT. No carotid bruit or JVD. No abdominal aortic bruit. Gastrointestinal: Nontender, no masses. No hepatomegaly, no splenomegaly. No hernia appreciated. Bowel sounds normal. Rectal exam deferred.  Musculoskeletal: Gait normal. No clubbing/cyanosis of digits.  Neurological: Normal balance/coordination. No tremor. No cranial nerve deficit on limited exam. Motor and sensation intact and symmetric. Cerebellar reflexes intact.  Skin: warm, dry, intact. No rash/ulcer. No concerning nevi or subq nodules on limited exam.   Psychiatric: Normal judgment/insight. Normal mood and affect. Oriented x3.    ASSESSMENT/PLAN:   1. Encounter to establish care Reviewed available information and discussed care concerns with patient.   2. Essential hypertension Checking labs as below.  Blood pressure not well controlled today on initial evaluation or recheck.  Continue Benicar 20 mg daily.  Recommend limiting dietary sodium and doing a dietary evaluation for potential hidden sodium.  Continue regular activity and start monitoring home blood pressures using an arm cuff.  Keep a diary over the next couple of weeks and return in 2 weeks for nurse visit for blood pressure check.  If blood pressure still elevated, we will need to make medication changes at that time.  Patient verbalized understanding is agreeable to the plan. - Lipid panel - COMPLETE METABOLIC PANEL WITH GFR - CBC with Differential/Platelet  3. Gastroesophageal reflux disease, unspecified whether esophagitis present Continue Protonix 20 mg daily  4. Genital herpes simplex, unspecified site Continue Valtrex 500 mg daily. - valACYclovir (VALTREX) 500 MG tablet; Take 1 tablet (500 mg total) by mouth daily.  Dispense: 90 tablet; Refill:  3  5. Stage 3a chronic kidney disease (HCC) Checking CMP.  6. Anxiety and depression Continue Wellbutrin 300 mg daily.  Refill sent to pharmacy.  7. Mixed hyperlipidemia Checking lipid panel.  Continue atorvastatin 20 mg daily  8. Prediabetes Checking hemoglobin A1c. - Hemoglobin A1c  9. Thyroid disorder screen Checking TSH. - TSH  10. Annual physical exam Checking labs as below.  Wellness information provided with AVS.  Up-to-date on preventative care. - Lipid panel - COMPLETE METABOLIC PANEL WITH GFR - CBC with Differential/Platelet   Orders Placed This Encounter  Procedures   TSH   Lipid panel   COMPLETE METABOLIC PANEL WITH GFR   CBC with Differential/Platelet   Hemoglobin A1c    Meds ordered this encounter  Medications   metFORMIN (GLUCOPHAGE XR) 500 MG 24 hr tablet    Sig: Take 1 tablet (500 mg total) by mouth daily with breakfast.    Dispense:  90 tablet    Refill:  3    Order Specific Question:   Supervising Provider    Answer:   MATTHEWS, CODY [[id number]]   olmesartan (BENICAR) 20 MG tablet    Sig: TAKE 1 TABLET(20 MG) BY MOUTH DAILY    Dispense:  90 tablet    Refill:  3    Order Specific  Question:   Supervising Provider    Answer:   MATTHEWS, CODY [[id number]]   pantoprazole (PROTONIX) 40 MG tablet    Sig: Take 1 tablet (40 mg total) by mouth daily.    Dispense:  90 tablet    Refill:  0    Order Specific Question:   Supervising Provider    Answer:   MATTHEWS, CODY [[id number]]   valACYclovir (VALTREX) 500 MG tablet    Sig: Take 1 tablet (500 mg total) by mouth daily.    Dispense:  90 tablet    Refill:  3    Order Specific Question:   Supervising Provider    Answer:   Luetta Nutting [[id number]]    Patient Instructions  Preventive Care 59-74 Years Old, Female Preventive care refers to lifestyle choices and visits with your health care provider that can promote health and wellness. Preventive care visits are also called wellness exams. What can I expect for my  preventive care visit? Counseling Your health care provider may ask you questions about your: Medical history, including: Past medical problems. Family medical history. Pregnancy history. Current health, including: Menstrual cycle. Method of birth control. Emotional well-being. Home life and relationship well-being. Sexual activity and sexual health. Lifestyle, including: Alcohol, nicotine or tobacco, and drug use. Access to firearms. Diet, exercise, and sleep habits. Work and work Statistician. Sunscreen use. Safety issues such as seatbelt and bike helmet use. Physical exam Your health care provider will check your: Height and weight. These may be used to calculate your BMI (body mass index). BMI is a measurement that tells if you are at a healthy weight. Waist circumference. This measures the distance around your waistline. This measurement also tells if you are at a healthy weight and may help predict your risk of certain diseases, such as type 2 diabetes and high blood pressure. Heart rate and blood pressure. Body temperature. Skin for abnormal spots. What immunizations do I need? Vaccines are usually given at various ages, according to a schedule. Your health care provider will recommend vaccines for you based on your age, medical history, and lifestyle or other factors, such as travel or where you work. What tests do I need? Screening Your health care provider may recommend screening tests for certain conditions. This may include: Lipid and cholesterol levels. Diabetes screening. This is done by checking your blood sugar (glucose) after you have not eaten for a while (fasting). Pelvic exam and Pap test. Hepatitis B test. Hepatitis C test. HIV (human immunodeficiency virus) test. STI (sexually transmitted infection) testing, if you are at risk. Lung cancer screening. Colorectal cancer screening. Mammogram. Talk with your health care provider about when you should start  having regular mammograms. This may depend on whether you have a family history of breast cancer. BRCA-related cancer screening. This may be done if you have a family history of breast, ovarian, tubal, or peritoneal cancers. Bone density scan. This is done to screen for osteoporosis. Talk with your health care provider about your test results, treatment options, and if necessary, the need for more tests. Follow these instructions at home: Eating and drinking  Eat a diet that includes fresh fruits and vegetables, whole grains, lean protein, and low-fat dairy products. Take vitamin and mineral supplements as recommended by your health care provider. Do not drink alcohol if: Your health care provider tells you not to drink. You are pregnant, may be pregnant, or are planning to become pregnant. If you drink alcohol: Limit how much you have to 0-1  drink a day. Know how much alcohol is in your drink. In the [place] one drink equals one 12 oz bottle of beer (355 mL), one 5 oz glass of wine (148 mL), or one 1 oz glass of hard liquor (44 mL). Lifestyle Brush your teeth every morning and night with fluoride toothpaste. Floss one time each day. Exercise for at least 30 minutes 5 or more days each week. Do not use any products that contain nicotine or tobacco. These products include cigarettes, chewing tobacco, and vaping devices, such as e-cigarettes. If you need help quitting, ask your health care provider. Do not use drugs. If you are sexually active, practice safe sex. Use a condom or other form of protection to prevent STIs. If you do not wish to become pregnant, use a form of birth control. If you plan to become pregnant, see your health care provider for a prepregnancy visit. Take aspirin only as told by your health care provider. Make sure that you understand how much to take and what form to take. Work with your health care provider to find out whether it is safe and beneficial for you to take  aspirin daily. Find healthy ways to manage stress, such as: Meditation, yoga, or listening to music. Journaling. Talking to a trusted person. Spending time with friends and family. Minimize exposure to UV radiation to reduce your risk of skin cancer. Safety Always wear your seat belt while driving or riding in a vehicle. Do not drive: If you have been drinking alcohol. Do not ride with someone who has been drinking. When you are tired or distracted. While texting. If you have been using any mind-altering substances or drugs. Wear a helmet and other protective equipment during sports activities. If you have firearms in your house, make sure you follow all gun safety procedures. Seek help if you have been physically or sexually abused. What's next? Visit your health care provider once a year for an annual wellness visit. Ask your health care provider how often you should have your eyes and teeth checked. Stay up to date on all vaccines. This information is not intended to replace advice given to you by your health care provider. Make sure you discuss any questions you have with your health care provider. Document Revised: 08/12/2020 Document Reviewed: 08/12/2020 Elsevier Patient Education  [place].   Follow-up plan: Return in about 2 weeks (around 03/31/2021) for nurse visit for BP check.  Clearnce Sorrel, DNP, APRN, FNP-BC [facility]

## 2021-03-17 ENCOUNTER — Encounter: Payer: Self-pay | Admitting: Medical-Surgical

## 2021-03-17 ENCOUNTER — Other Ambulatory Visit: Payer: Self-pay

## 2021-03-17 ENCOUNTER — Ambulatory Visit (INDEPENDENT_AMBULATORY_CARE_PROVIDER_SITE_OTHER): Payer: BC Managed Care – PPO | Admitting: Medical-Surgical

## 2021-03-17 VITALS — BP 163/92 | HR 94 | Resp 20 | Ht 65.0 in | Wt 158.0 lb

## 2021-03-17 DIAGNOSIS — Z1329 Encounter for screening for other suspected endocrine disorder: Secondary | ICD-10-CM

## 2021-03-17 DIAGNOSIS — F419 Anxiety disorder, unspecified: Secondary | ICD-10-CM

## 2021-03-17 DIAGNOSIS — K219 Gastro-esophageal reflux disease without esophagitis: Secondary | ICD-10-CM

## 2021-03-17 DIAGNOSIS — A6000 Herpesviral infection of urogenital system, unspecified: Secondary | ICD-10-CM | POA: Diagnosis not present

## 2021-03-17 DIAGNOSIS — N1831 Chronic kidney disease, stage 3a: Secondary | ICD-10-CM

## 2021-03-17 DIAGNOSIS — F32A Depression, unspecified: Secondary | ICD-10-CM

## 2021-03-17 DIAGNOSIS — Z0000 Encounter for general adult medical examination without abnormal findings: Secondary | ICD-10-CM

## 2021-03-17 DIAGNOSIS — R7303 Prediabetes: Secondary | ICD-10-CM

## 2021-03-17 DIAGNOSIS — Z7689 Persons encountering health services in other specified circumstances: Secondary | ICD-10-CM

## 2021-03-17 DIAGNOSIS — E782 Mixed hyperlipidemia: Secondary | ICD-10-CM

## 2021-03-17 DIAGNOSIS — I10 Essential (primary) hypertension: Secondary | ICD-10-CM | POA: Diagnosis not present

## 2021-03-17 MED ORDER — METFORMIN HCL ER 500 MG PO TB24: 500.0000 mg | ORAL_TABLET | Freq: Every day | ORAL | 3 refills | Status: DC

## 2021-03-17 MED ORDER — OLMESARTAN MEDOXOMIL 20 MG PO TABS: ORAL_TABLET | ORAL | 3 refills | Status: DC

## 2021-03-17 MED ORDER — BUPROPION HCL ER (XL) 300 MG PO TB24: ORAL_TABLET | ORAL | 3 refills | Status: DC

## 2021-03-17 MED ORDER — PANTOPRAZOLE SODIUM 40 MG PO TBEC: 40.0000 mg | DELAYED_RELEASE_TABLET | Freq: Every day | ORAL | 0 refills | Status: DC

## 2021-03-17 MED ORDER — VALACYCLOVIR HCL 500 MG PO TABS: 500.0000 mg | ORAL_TABLET | Freq: Every day | ORAL | 3 refills | Status: DC

## 2021-03-17 NOTE — Patient Instructions (Signed)

## 2021-03-18 LAB — COMPLETE METABOLIC PANEL WITH GFR
AG Ratio: 1.6 (calc) (ref 1.0–2.5)
ALT: 14 U/L (ref 6–29)
AST: 15 U/L (ref 10–35)
Albumin: 4.4 g/dL (ref 3.6–5.1)
Alkaline phosphatase (APISO): 78 U/L (ref 37–153)
BUN/Creatinine Ratio: 10 (calc) (ref 6–22)
BUN: 11 mg/dL (ref 7–25)
CO2: 32 mmol/L (ref 20–32)
Calcium: 10.2 mg/dL (ref 8.6–10.4)
Chloride: 106 mmol/L (ref 98–110)
Creat: 1.14 mg/dL — ABNORMAL HIGH (ref 0.50–1.05)
Globulin: 2.7 g/dL (calc) (ref 1.9–3.7)
Glucose, Bld: 101 mg/dL — ABNORMAL HIGH (ref 65–99)
Potassium: 3.9 mmol/L (ref 3.5–5.3)
Sodium: 142 mmol/L (ref 135–146)
Total Bilirubin: 0.3 mg/dL (ref 0.2–1.2)
Total Protein: 7.1 g/dL (ref 6.1–8.1)
eGFR: 54 mL/min/{1.73_m2} — ABNORMAL LOW (ref >=60)

## 2021-03-18 LAB — LIPID PANEL
Cholesterol: 178 mg/dL (ref <200)
HDL: 48 mg/dL — ABNORMAL LOW (ref >=50)
LDL Cholesterol (Calc): 96 mg/dL (calc)
Non-HDL Cholesterol (Calc): 130 mg/dL (calc) — ABNORMAL HIGH (ref <130)
Total CHOL/HDL Ratio: 3.7 (calc) (ref <5.0)
Triglycerides: 225 mg/dL — ABNORMAL HIGH (ref <150)

## 2021-03-18 LAB — CBC WITH DIFFERENTIAL/PLATELET
Absolute Monocytes: 448 cells/uL (ref 200–950)
Basophils Absolute: 39 cells/uL (ref 0–200)
Basophils Relative: 0.7 %
Eosinophils Absolute: 78 cells/uL (ref 15–500)
Eosinophils Relative: 1.4 %
HCT: 36.5 % (ref 35.0–45.0)
Hemoglobin: 12.2 g/dL (ref 11.7–15.5)
Lymphs Abs: 1725 cells/uL (ref 850–3900)
MCH: 30.9 pg (ref 27.0–33.0)
MCHC: 33.4 g/dL (ref 32.0–36.0)
MCV: 92.4 fL (ref 80.0–100.0)
MPV: 11 fL (ref 7.5–12.5)
Monocytes Relative: 8 %
Neutro Abs: 3310 cells/uL (ref 1500–7800)
Neutrophils Relative %: 59.1 %
Platelets: 251 10*3/uL (ref 140–400)
RBC: 3.95 10*6/uL (ref 3.80–5.10)
RDW: 13.1 % (ref 11.0–15.0)
Total Lymphocyte: 30.8 %
WBC: 5.6 10*3/uL (ref 3.8–10.8)

## 2021-03-18 LAB — HEMOGLOBIN A1C
Hgb A1c MFr Bld: 5.9 % of total Hgb — ABNORMAL HIGH (ref <5.7)
Mean Plasma Glucose: 123 mg/dL
eAG (mmol/L): 6.8 mmol/L

## 2021-03-18 LAB — TSH: TSH: 1.89 mIU/L (ref 0.40–4.50)

## 2021-03-31 ENCOUNTER — Telehealth: Payer: Self-pay

## 2021-03-31 ENCOUNTER — Ambulatory Visit (INDEPENDENT_AMBULATORY_CARE_PROVIDER_SITE_OTHER): Payer: BC Managed Care – PPO | Admitting: Medical-Surgical

## 2021-03-31 ENCOUNTER — Other Ambulatory Visit: Payer: Self-pay

## 2021-03-31 ENCOUNTER — Encounter: Payer: Self-pay | Admitting: Medical-Surgical

## 2021-03-31 VITALS — BP 156/81 | HR 94

## 2021-03-31 DIAGNOSIS — I10 Essential (primary) hypertension: Secondary | ICD-10-CM | POA: Diagnosis not present

## 2021-03-31 MED ORDER — OLMESARTAN MEDOXOMIL-HCTZ 20-12.5 MG PO TABS: 1.0000 | ORAL_TABLET | Freq: Every day | ORAL | 0 refills | Status: DC

## 2021-03-31 NOTE — Telephone Encounter (Signed)
Called [facility] regarding Benicar rx per patient's request. Contacted pt and let her know the prescription is being filled.

## 2021-03-31 NOTE — Progress Notes (Signed)
Established Patient Office Visit  Subjective:  Patient ID: Kathy Gutierrez, female    DOB: 07-02-1955  Age: 66 y.o. MRN: [id number]  CC:  Chief Complaint  Patient presents with   Hypertension    HPI Kathy Gutierrez presents for blood pressure. Denies chest pain, SOB, and dizziness. She also recorded her BP readings for the week: 1/20  135/95 1/21  147/93 1/21  136/80 1/22  153/99 1/23  133/89 1/24  124/80 1/25  119/82 1/27  132/86 1/27  133/85 1/28  134/84 1/28  124/86 1/29  150/98 1/29  117/79 1/30  139/83  Past Medical History:  Diagnosis Date   Anxiety    GERD (gastroesophageal reflux disease)    High blood pressure    High cholesterol     No past surgical history on file.  Family History  Problem Relation Age of Onset   Diabetes Mother    High Cholesterol Mother    High blood pressure Mother    Aortic aneurysm Mother    High blood pressure Father    Anuerysm Sister    Multiple sclerosis Sister     Social History   Socioeconomic History   Marital status: Divorced    Spouse name: Not on file   Number of children: 1   Years of education: Not on file   Highest education level: Master's degree (e.g., MA, MS, MEng, MEd, MSW, MBA)  Occupational History   Occupation: Fish farm manager: [employer]  Tobacco Use   Smoking status: Never   Smokeless tobacco: Never  Vaping Use   Vaping Use: Never used  Substance and Sexual Activity   Alcohol use: Not Currently    Comment: Maybe a glassnof wine 2 ntimes a month   Drug use: Never   Sexual activity: Not Currently    Birth control/protection: None  Other Topics Concern   Not on file  Social History Narrative   Not on file   Social Determinants of Health   Financial Resource Strain: Not on file  Food Insecurity: Not on file  Transportation Needs: Not on file  Physical Activity: Not on file  Stress: Not on file  Social Connections: Not on file  Intimate Partner Violence: Not on file     Outpatient Medications Prior to Visit  Medication Sig Dispense Refill   atorvastatin (LIPITOR) 20 MG tablet TAKE 1 TABLET(20 MG) BY MOUTH DAILY 90 tablet 1   buPROPion (WELLBUTRIN XL) 300 MG 24 hr tablet TK 1 T PO QAM 90 tablet 3   metFORMIN (GLUCOPHAGE XR) 500 MG 24 hr tablet Take 1 tablet (500 mg total) by mouth daily with breakfast. 90 tablet 3   Multiple Vitamins-Calcium (ONE-A-DAY WOMENS PO) Take by mouth.     olmesartan (BENICAR) 20 MG tablet TAKE 1 TABLET(20 MG) BY MOUTH DAILY 90 tablet 3   pantoprazole (PROTONIX) 40 MG tablet Take 1 tablet (40 mg total) by mouth daily. 90 tablet 0   valACYclovir (VALTREX) 500 MG tablet Take 1 tablet (500 mg total) by mouth daily. 90 tablet 3   No facility-administered medications prior to visit.    No Known Allergies  ROS Review of Systems    Objective:    Physical Exam  BP (!) 156/81 (BP Location: Left Arm, Patient Position: Sitting, Cuff Size: Normal)    Pulse 94    SpO2 100%  Wt Readings from Last 3 Encounters:  03/17/21 158 lb (71.7 kg)  12/03/20 159 lb (72.1 kg)  09/24/20 160 lb (  72.6 kg)     There are no preventive care reminders to display for this patient.  There are no preventive care reminders to display for this patient.  Lab Results  Component Value Date   TSH 1.89 03/17/2021   Lab Results  Component Value Date   WBC 5.6 03/17/2021   HGB 12.2 03/17/2021   HCT 36.5 03/17/2021   MCV 92.4 03/17/2021   PLT 251 03/17/2021   Lab Results  Component Value Date   NA 142 03/17/2021   K 3.9 03/17/2021   CO2 32 03/17/2021   GLUCOSE 101 (H) 03/17/2021   BUN 11 03/17/2021   CREATININE 1.14 (H) 03/17/2021   BILITOT 0.3 03/17/2021   AST 15 03/17/2021   ALT 14 03/17/2021   PROT 7.1 03/17/2021   CALCIUM 10.2 03/17/2021   EGFR 54 (L) 03/17/2021   Lab Results  Component Value Date   CHOL 178 03/17/2021   Lab Results  Component Value Date   HDL 48 (L) 03/17/2021   Lab Results  Component Value Date   LDLCALC  96 03/17/2021   Lab Results  Component Value Date   TRIG 225 (H) 03/17/2021   Lab Results  Component Value Date   CHOLHDL 3.7 03/17/2021   Lab Results  Component Value Date   HGBA1C 5.9 (H) 03/17/2021      Assessment & Plan:  A new medication for olmesartan hydrochlorothiazide will be sent to the preferred pharmacy. Patient advised to schedule a follow up with Samuel Bouche, DNP in 2 weeks. Problem List Items Addressed This Visit       Cardiovascular and Mediastinum   Essential hypertension - Primary    No orders of the defined types were placed in this encounter.   Follow-up: 2 weeks   Beverlee Nims, CMA

## 2021-03-31 NOTE — Progress Notes (Signed)
Agree with documentation as above.  ? ?___________________________________________ ?Morey Andonian L. Lukis Bunt, DNP, APRN, FNP-BC ?[facility] ?[facility] ? ?

## 2021-04-07 ENCOUNTER — Encounter: Payer: Self-pay | Admitting: Medical-Surgical

## 2021-05-09 ENCOUNTER — Other Ambulatory Visit: Payer: Self-pay | Admitting: Medical-Surgical

## 2021-05-10 MED ORDER — PANTOPRAZOLE SODIUM 40 MG PO TBEC: 40.0000 mg | DELAYED_RELEASE_TABLET | Freq: Every day | ORAL | 2 refills | Status: DC

## 2021-06-09 ENCOUNTER — Other Ambulatory Visit: Payer: Self-pay

## 2021-06-09 MED ORDER — OLMESARTAN MEDOXOMIL-HCTZ 20-12.5 MG PO TABS: 1.0000 | ORAL_TABLET | Freq: Every day | ORAL | 0 refills | Status: DC

## 2021-06-11 ENCOUNTER — Other Ambulatory Visit: Payer: Self-pay | Admitting: Medical-Surgical

## 2021-07-15 ENCOUNTER — Ambulatory Visit: Payer: BC Managed Care – PPO | Admitting: Family Medicine

## 2021-07-15 NOTE — Progress Notes (Deleted)
   Acute Office Visit  Subjective:     Patient ID: Kathy Gutierrez, female    DOB: 1955-06-03, 66 y.o.   MRN: [id number]  No chief complaint on file.   HPI Patient is in today for ST and cough.   ROS      Objective:    There were no vitals taken for this visit. {Vitals History (Optional):23777}  Physical Exam  No results found for any visits on 07/15/21.      Assessment & Plan:   Problem List Items Addressed This Visit   None   No orders of the defined types were placed in this encounter.   No follow-ups on file.  Nani Gasser, MD

## 2021-07-24 ENCOUNTER — Other Ambulatory Visit: Payer: Self-pay | Admitting: Medical-Surgical

## 2021-07-27 MED ORDER — OLMESARTAN MEDOXOMIL-HCTZ 20-12.5 MG PO TABS: 1.0000 | ORAL_TABLET | Freq: Every day | ORAL | 0 refills | Status: DC

## 2021-07-29 ENCOUNTER — Emergency Department
Admission: EM | Admit: 2021-07-29 | Discharge: 2021-07-29 | Disposition: A | Discharge status: Home / Self Care | Payer: BC Managed Care – PPO | Source: Home / Self Care

## 2021-07-29 DIAGNOSIS — R059 Cough, unspecified: Secondary | ICD-10-CM | POA: Diagnosis not present

## 2021-07-29 DIAGNOSIS — J309 Allergic rhinitis, unspecified: Secondary | ICD-10-CM

## 2021-07-29 MED ORDER — BENZONATATE 200 MG PO CAPS: 200.0000 mg | ORAL_CAPSULE | Freq: Three times a day (TID) | ORAL | 0 refills | Status: AC | PRN

## 2021-07-29 NOTE — ED Triage Notes (Signed)
Pt c/o cough and RT ear pain since yesterday. Some post nasal drainage. Coricidin sinus prn. Denies fever.

## 2021-07-29 NOTE — Discharge Instructions (Addendum)
Advised patient to take OTC Zyrtec 10 mg daily for the next 5 days for postnasal drainage/drip.  Advised may use Tessalon Perles daily or as needed for cough.  Encouraged patient to increase daily water intake while taking these medications.  Advised patient if symptoms worsen and/or unresolved please follow-up with PCP or here for further evaluation.

## 2021-07-29 NOTE — ED Provider Notes (Signed)
[name]    CSN: [id number] Arrival date & time: 07/29/21  0912      History   Chief Complaint Chief Complaint  Patient presents with   Cough   Otalgia    RT   Nasal Congestion    HPI Kathy Gutierrez is a 66 y.o. female.   HPI 66 year old female presents with cough and right ear pain since yesterday.  Reports some postnasal drainage.  Reports taking OTC Coricidin sinus as needed.  PMH significant for HTN, CKD stage III, and anxiety/depression.  Past Medical History:  Diagnosis Date   Anxiety    GERD (gastroesophageal reflux disease)    High blood pressure    High cholesterol     Patient Active Problem List   Diagnosis Date Noted   Stage 3a chronic kidney disease ([place]) 12/10/2018   Prediabetes 08/08/2018   Essential hypertension 06/15/2017   Gastroesophageal reflux disease 06/15/2017   Mixed hyperlipidemia 06/15/2017   Anxiety and depression 06/15/2017   Genital herpes simplex 06/15/2017    History reviewed. No pertinent surgical history.  OB History   No obstetric history on file.      Home Medications    Prior to Admission medications   Medication Sig Start Date End Date Taking? Authorizing Provider  benzonatate (TESSALON) 200 MG capsule Take 1 capsule (200 mg total) by mouth 3 (three) times daily as needed for up to 7 days for cough. 07/29/21 08/05/21 Yes Eliezer Lofts, FNP  atorvastatin (LIPITOR) 20 MG tablet TAKE 1 TABLET(20 MG) BY MOUTH DAILY 11/30/20   Breeback, Jade L, PA-C  buPROPion (WELLBUTRIN XL) 300 MG 24 hr tablet TK 1 T PO QAM 03/17/21   Samuel Bouche, NP  metFORMIN (GLUCOPHAGE XR) 500 MG 24 hr tablet Take 1 tablet (500 mg total) by mouth daily with breakfast. 03/17/21   Samuel Bouche, NP  Multiple Vitamins-Calcium (ONE-A-DAY WOMENS PO) Take by mouth.    [provider]  olmesartan-hydrochlorothiazide (BENICAR HCT) 20-12.5 MG tablet Take 1 tablet by mouth daily. Needs appointment. 07/27/21   Samuel Bouche, NP  pantoprazole (PROTONIX)  40 MG tablet Take 1 tablet (40 mg total) by mouth daily. 05/10/21   Samuel Bouche, NP  valACYclovir (VALTREX) 500 MG tablet Take 1 tablet (500 mg total) by mouth daily. 03/17/21   Samuel Bouche, NP    Family History Family History  Problem Relation Age of Onset   Diabetes Mother    High Cholesterol Mother    High blood pressure Mother    Aortic aneurysm Mother    High blood pressure Father    Anuerysm Sister    Multiple sclerosis Sister     Social History Social History   Tobacco Use   Smoking status: Never   Smokeless tobacco: Never  Vaping Use   Vaping Use: Never used  Substance Use Topics   Alcohol use: Not Currently    Comment: Maybe a glassnof wine 2 ntimes a month   Drug use: Never     Allergies   Patient has no known allergies.   Review of Systems Review of Systems  HENT:  Positive for congestion and postnasal drip.   Respiratory:  Positive for cough.   All other systems reviewed and are negative.   Physical Exam Triage Vital Signs ED Triage Vitals [07/29/21 0952]  Enc Vitals Group     BP (!) 169/100     Pulse Rate (!) 102     Resp 18     Temp 98.4 F (36.9 C)  Temp Source Oral     SpO2 100 %     Weight      Height      Head Circumference      Peak Flow      Pain Score 0     Pain Loc      Pain Edu?      Excl. in [place]?    No data found.  Updated Vital Signs BP (!) 137/94 (BP Location: Left Arm)   Pulse (!) 102   Temp 98.4 F (36.9 C) (Oral)   Resp 18   SpO2 100%    Physical Exam Vitals and nursing note reviewed.  Constitutional:      Appearance: Normal appearance. She is normal weight.  HENT:     Head: Normocephalic and atraumatic.     Right Ear: Tympanic membrane, ear canal and external ear normal.     Left Ear: Tympanic membrane, ear canal and external ear normal.     Mouth/Throat:     Mouth: Mucous membranes are moist.     Pharynx: Oropharynx is clear.  Eyes:     Extraocular Movements: Extraocular movements intact.      Conjunctiva/sclera: Conjunctivae normal.     Pupils: Pupils are equal, round, and reactive to light.  Cardiovascular:     Rate and Rhythm: Normal rate and regular rhythm.     Pulses: Normal pulses.     Heart sounds: Normal heart sounds.  Pulmonary:     Effort: Pulmonary effort is normal.     Breath sounds: Normal breath sounds. No wheezing, rhonchi or rales.  Musculoskeletal:     Cervical back: Normal range of motion and neck supple.  Skin:    General: Skin is warm and dry.  Neurological:     General: No focal deficit present.     Mental Status: She is alert and oriented to person, place, and time.     UC Treatments / Results  Labs (all labs ordered are listed, but only abnormal results are displayed) Labs Reviewed - No data to display  EKG   Radiology No results found.  Procedures Procedures (including critical care time)  Medications Ordered in UC Medications - No data to display  Initial Impression / Assessment and Plan / UC Course  I have reviewed the triage vital signs and the nursing notes.  Pertinent labs & imaging results that were available during my care of the patient were reviewed by me and considered in my medical decision making (see chart for details).       MDM: 1.  Allergic rhinitis, unspecified seasonality-Advised patient to take OTC Zyrtec 10 mg daily for the next 5 days for postnasal drainage/drip. 2.  Cough-Rx'd Tessalon Perles. Advised patient to take OTC Zyrtec 10 mg daily for the next 5 days for postnasal drainage/drip.  Advised may use Tessalon Perles daily or as needed for cough.  Encouraged patient to increase daily water intake while taking these medications.  Advised patient if symptoms worsen and/or unresolved please follow-up with PCP or here for further evaluation.  Patient discharged home, hemodynamically stable.   Final Clinical Impressions(s) / UC Diagnoses   Final diagnoses:  Cough, unspecified type  Allergic rhinitis,  unspecified seasonality, unspecified trigger     Discharge Instructions      Advised patient to take OTC Zyrtec 10 mg daily for the next 5 days for postnasal drainage/drip.  Advised may use Tessalon Perles daily or as needed for cough.  Encouraged patient to increase daily  water intake while taking these medications.  Advised patient if symptoms worsen and/or unresolved please follow-up with PCP or here for further evaluation.     ED Prescriptions     Medication Sig Dispense Auth. Provider   benzonatate (TESSALON) 200 MG capsule Take 1 capsule (200 mg total) by mouth 3 (three) times daily as needed for up to 7 days for cough. 40 capsule Eliezer Lofts, FNP      PDMP not reviewed this encounter.   Eliezer Lofts, [place] 07/29/21 1043

## 2021-08-19 ENCOUNTER — Encounter: Payer: Self-pay | Admitting: Medical-Surgical

## 2021-08-23 ENCOUNTER — Other Ambulatory Visit: Payer: Self-pay | Admitting: Medical-Surgical

## 2021-08-23 ENCOUNTER — Encounter: Payer: Self-pay | Admitting: Medical-Surgical

## 2021-08-24 ENCOUNTER — Encounter: Payer: Self-pay | Admitting: Medical-Surgical

## 2021-09-15 ENCOUNTER — Encounter: Payer: Self-pay | Admitting: Medical-Surgical

## 2021-09-15 ENCOUNTER — Ambulatory Visit: Payer: BC Managed Care – PPO | Admitting: Medical-Surgical

## 2021-09-15 VITALS — BP 123/77 | HR 81 | Resp 20 | Ht 65.0 in | Wt 160.2 lb

## 2021-09-15 DIAGNOSIS — R7303 Prediabetes: Secondary | ICD-10-CM | POA: Diagnosis not present

## 2021-09-15 DIAGNOSIS — E782 Mixed hyperlipidemia: Secondary | ICD-10-CM | POA: Diagnosis not present

## 2021-09-15 DIAGNOSIS — I10 Essential (primary) hypertension: Secondary | ICD-10-CM

## 2021-09-15 DIAGNOSIS — N1831 Chronic kidney disease, stage 3a: Secondary | ICD-10-CM | POA: Diagnosis not present

## 2021-09-15 MED ORDER — OLMESARTAN MEDOXOMIL-HCTZ 20-12.5 MG PO TABS: 1.0000 | ORAL_TABLET | Freq: Every day | ORAL | 1 refills | Status: DC

## 2021-09-15 MED ORDER — BUSPIRONE HCL 10 MG PO TABS: 10.0000 mg | ORAL_TABLET | Freq: Two times a day (BID) | ORAL | 1 refills | Status: DC

## 2021-09-15 NOTE — Progress Notes (Signed)
Established Patient Office Visit  Subjective   Patient ID: Kathy Gutierrez, female   DOB: 1955/07/09 Age: 66 y.o. MRN: [id number]   Chief Complaint  Patient presents with   medicaton change   Follow-up    HPI Pleasant 66 year old female presenting today to follow-up on:   HTN: Taking olmesartan-HCTZ 20-12.5mg  daily, tolerating well without side effects. Has noted that she has been more anxious lately and wonders if that is affecting her blood pressure. Following a low sodium diet and staying active. Denies CP, SOB, palpitations, lower extremity edema, dizziness, headaches, or vision changes.  Anxiety: has noted an increase in anxiety, mostly related to situational issues but admits to a baseline level of anxiety since she was young. Has been taking Wellbutrin for quite a while and feels that it's helpful for the depressive symptoms but not for anxiety. Is interested in options to help manage things a bit better. 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    Objective:    Vitals:   09/15/21 1115 09/15/21 1154  BP: (!) 145/90 123/77  Pulse: 91 81  Resp: 20 20  Height: 5\' 5"  (1.651 m)   Weight: 160 lb 3.2 oz (72.7 kg)   SpO2: 100% 98%  BMI (Calculated): 26.66     Physical Exam Vitals and nursing note reviewed.  Constitutional:      General: She is not in acute distress.    Appearance: Normal appearance. She is not ill-appearing.  HENT:     Head: Normocephalic and atraumatic.  Cardiovascular:     Rate and Rhythm: Normal rate and regular rhythm.     Pulses: Normal pulses.     Heart sounds: Normal heart sounds.  Pulmonary:     Effort: Pulmonary effort is normal. No respiratory distress.     Breath sounds: Normal breath sounds. No wheezing, rhonchi or rales.  Skin:    General: Skin is warm and dry.  Neurological:     Mental Status: She is alert and oriented to person, place, and time.  Psychiatric:        Mood and Affect: Mood normal.         Behavior: Behavior normal.        Thought Content: Thought content normal.        Judgment: Judgment normal.   No results found for this or any previous visit (from the past 24 hour(s)).     The 10-year ASCVD risk score (Arnett DK, et al., 2019) is: 7.4%   Values used to calculate the score:     Age: 40 years     Sex: Female     Is Non-Hispanic African American: Yes     Diabetic: No     Tobacco smoker: No     Systolic Blood Pressure: 123 mmHg     Is BP treated: Yes     HDL Cholesterol: 48 mg/dL     Total Cholesterol: 178 mg/dL   Assessment & Plan:   1. Essential hypertension Checking labs today. Recheck of BP looked good so continue Olmesartan-HCTZ daily as prescribed. Recommend checking BP at home with a goal of 130/80 or less. Continue low sodium diet and regular intentional exercise.  - CBC with Differential/Platelet - COMPLETE METABOLIC PANEL WITH GFR - Lipid panel  2. Mixed hyperlipidemia Checking labs. Continue Atorvastatin as prescribed.  - COMPLETE METABOLIC PANEL WITH GFR - Lipid panel  3. Stage 3a chronic kidney disease (HCC) Checking CMP.  - COMPLETE METABOLIC PANEL  WITH GFR  4. Prediabetes Checking A1c.  - Hemoglobin A1c  5. Anxiety Continue Wellbutrin. Adding BuSpar 10mg  twice daily. If counseling desired, she will let me know.   Return in about 6 months (around 03/18/2022) for HTN/HLD/Prediabetes follow up.  ___________________________________________ 03/20/2022, DNP, APRN, FNP-BC Primary Care and Sports Medicine [facility] [place]

## 2021-09-16 LAB — COMPLETE METABOLIC PANEL WITH GFR
AG Ratio: 1.6 (calc) (ref 1.0–2.5)
ALT: 16 U/L (ref 6–29)
AST: 17 U/L (ref 10–35)
Albumin: 4.5 g/dL (ref 3.6–5.1)
Alkaline phosphatase (APISO): 79 U/L (ref 37–153)
BUN/Creatinine Ratio: 13 (calc) (ref 6–22)
BUN: 15 mg/dL (ref 7–25)
CO2: 28 mmol/L (ref 20–32)
Calcium: 10.3 mg/dL (ref 8.6–10.4)
Chloride: 103 mmol/L (ref 98–110)
Creat: 1.14 mg/dL — ABNORMAL HIGH (ref 0.50–1.05)
Globulin: 2.8 g/dL (calc) (ref 1.9–3.7)
Glucose, Bld: 96 mg/dL (ref 65–139)
Potassium: 3.8 mmol/L (ref 3.5–5.3)
Sodium: 142 mmol/L (ref 135–146)
Total Bilirubin: 0.3 mg/dL (ref 0.2–1.2)
Total Protein: 7.3 g/dL (ref 6.1–8.1)
eGFR: 54 mL/min/{1.73_m2} — ABNORMAL LOW (ref >=60)

## 2021-09-16 LAB — LIPID PANEL
Cholesterol: 185 mg/dL (ref <200)
HDL: 52 mg/dL (ref >=50)
LDL Cholesterol (Calc): 103 mg/dL (calc) — ABNORMAL HIGH
Non-HDL Cholesterol (Calc): 133 mg/dL (calc) — ABNORMAL HIGH (ref <130)
Total CHOL/HDL Ratio: 3.6 (calc) (ref <5.0)
Triglycerides: 189 mg/dL — ABNORMAL HIGH (ref <150)

## 2021-09-16 LAB — CBC WITH DIFFERENTIAL/PLATELET
Absolute Monocytes: 451 cells/uL (ref 200–950)
Basophils Absolute: 43 cells/uL (ref 0–200)
Basophils Relative: 0.7 %
Eosinophils Absolute: 67 cells/uL (ref 15–500)
Eosinophils Relative: 1.1 %
HCT: 36 % (ref 35.0–45.0)
Hemoglobin: 11.9 g/dL (ref 11.7–15.5)
Lymphs Abs: 1739 cells/uL (ref 850–3900)
MCH: 30.7 pg (ref 27.0–33.0)
MCHC: 33.1 g/dL (ref 32.0–36.0)
MCV: 92.8 fL (ref 80.0–100.0)
MPV: 10.9 fL (ref 7.5–12.5)
Monocytes Relative: 7.4 %
Neutro Abs: 3800 cells/uL (ref 1500–7800)
Neutrophils Relative %: 62.3 %
Platelets: 270 10*3/uL (ref 140–400)
RBC: 3.88 10*6/uL (ref 3.80–5.10)
RDW: 13.3 % (ref 11.0–15.0)
Total Lymphocyte: 28.5 %
WBC: 6.1 10*3/uL (ref 3.8–10.8)

## 2021-09-16 LAB — HEMOGLOBIN A1C
Hgb A1c MFr Bld: 5.8 % of total Hgb — ABNORMAL HIGH (ref <5.7)
Mean Plasma Glucose: 120 mg/dL
eAG (mmol/L): 6.6 mmol/L

## 2021-09-25 ENCOUNTER — Other Ambulatory Visit: Payer: Self-pay | Admitting: Medical-Surgical

## 2021-10-16 ENCOUNTER — Encounter: Payer: Self-pay | Admitting: Medical-Surgical

## 2021-10-16 DIAGNOSIS — E782 Mixed hyperlipidemia: Secondary | ICD-10-CM

## 2021-10-18 MED ORDER — ATORVASTATIN CALCIUM 20 MG PO TABS: ORAL_TABLET | ORAL | 1 refills | Status: DC

## 2021-10-19 ENCOUNTER — Encounter: Payer: Self-pay | Admitting: Medical-Surgical

## 2021-10-20 LAB — HM MAMMOGRAPHY

## 2021-10-22 NOTE — Telephone Encounter (Signed)
I will need to call the lab and add on codes.

## 2021-11-07 ENCOUNTER — Other Ambulatory Visit: Payer: Self-pay | Admitting: Medical-Surgical

## 2021-11-11 NOTE — Telephone Encounter (Signed)
Spoke with Suzette Battiest at [facility].  A1c charges were written off due to contractual agreement.  Tiajuana Amass, CMA

## 2021-11-13 ENCOUNTER — Encounter: Payer: Self-pay | Admitting: Medical-Surgical

## 2022-01-03 ENCOUNTER — Other Ambulatory Visit: Payer: Self-pay | Admitting: Medical-Surgical

## 2022-01-12 ENCOUNTER — Other Ambulatory Visit: Payer: Self-pay | Admitting: Medical-Surgical

## 2022-01-12 DIAGNOSIS — E782 Mixed hyperlipidemia: Secondary | ICD-10-CM

## 2022-01-12 NOTE — Telephone Encounter (Signed)
Patient is scheduled for 03/18/22 @ 9:00. Tvt

## 2022-02-23 ENCOUNTER — Encounter: Payer: Self-pay | Admitting: Medical-Surgical

## 2022-02-24 MED ORDER — OLMESARTAN MEDOXOMIL-HCTZ 20-12.5 MG PO TABS: 1.0000 | ORAL_TABLET | Freq: Every day | ORAL | 0 refills | Status: DC

## 2022-03-01 ENCOUNTER — Other Ambulatory Visit: Payer: Self-pay | Admitting: Medical-Surgical

## 2022-03-06 ENCOUNTER — Encounter: Payer: Self-pay | Admitting: Medical-Surgical

## 2022-03-06 DIAGNOSIS — F32A Depression, unspecified: Secondary | ICD-10-CM

## 2022-03-07 MED ORDER — BUPROPION HCL ER (XL) 300 MG PO TB24: ORAL_TABLET | ORAL | 3 refills | Status: DC

## 2022-03-18 ENCOUNTER — Ambulatory Visit: Payer: BC Managed Care – PPO | Admitting: Medical-Surgical

## 2022-03-18 ENCOUNTER — Encounter: Payer: Self-pay | Admitting: Medical-Surgical

## 2022-03-18 VITALS — BP 123/80 | HR 103 | Resp 20 | Ht 65.0 in | Wt 160.5 lb

## 2022-03-18 DIAGNOSIS — F419 Anxiety disorder, unspecified: Secondary | ICD-10-CM | POA: Diagnosis not present

## 2022-03-18 DIAGNOSIS — R7303 Prediabetes: Secondary | ICD-10-CM

## 2022-03-18 DIAGNOSIS — K219 Gastro-esophageal reflux disease without esophagitis: Secondary | ICD-10-CM

## 2022-03-18 DIAGNOSIS — I10 Essential (primary) hypertension: Secondary | ICD-10-CM

## 2022-03-18 DIAGNOSIS — A6000 Herpesviral infection of urogenital system, unspecified: Secondary | ICD-10-CM

## 2022-03-18 DIAGNOSIS — F32A Depression, unspecified: Secondary | ICD-10-CM

## 2022-03-18 DIAGNOSIS — E782 Mixed hyperlipidemia: Secondary | ICD-10-CM | POA: Diagnosis not present

## 2022-03-18 DIAGNOSIS — N1831 Chronic kidney disease, stage 3a: Secondary | ICD-10-CM

## 2022-03-18 MED ORDER — BLOOD GLUCOSE MONITOR KIT: PACK | 0 refills | Status: AC

## 2022-03-18 MED ORDER — BUSPIRONE HCL 10 MG PO TABS: ORAL_TABLET | ORAL | 3 refills | Status: DC

## 2022-03-18 MED ORDER — PANTOPRAZOLE SODIUM 20 MG PO TBEC: 20.0000 mg | DELAYED_RELEASE_TABLET | Freq: Every day | ORAL | 1 refills | Status: DC

## 2022-03-18 MED ORDER — METFORMIN HCL ER 500 MG PO TB24: 500.0000 mg | ORAL_TABLET | Freq: Every day | ORAL | 3 refills | Status: DC

## 2022-03-18 MED ORDER — VALACYCLOVIR HCL 500 MG PO TABS: 500.0000 mg | ORAL_TABLET | Freq: Every day | ORAL | 3 refills | Status: DC

## 2022-03-18 MED ORDER — ATORVASTATIN CALCIUM 20 MG PO TABS: ORAL_TABLET | ORAL | 3 refills | Status: DC

## 2022-03-18 NOTE — Progress Notes (Signed)
Established Patient Office Visit  Subjective   Patient ID: Kathy Gutierrez, female   DOB: 07/28/1955 Age: 67 y.o. MRN: [id number]   Chief Complaint  Patient presents with   Follow-up   Hypertension   Diabetes   HPI Pleasant 67 year old female presenting today for the following:  Hypertension: Currently taking olmesartan-hydrochlorothiazide 20-12.5 mg daily, tolerating well without side effects.  Not monitoring blood pressure at home.  Following a low-sodium diet.  Not exercising for the last 4 weeks but plans to join Silver sneakers and restart simvastatin. Denies CP, SOB, palpitations, lower extremity edema, dizziness, headaches, or vision changes.  Hyperlipidemia: Taking Lipitor 20 mg daily, tolerating well without side effects.  Tries to eat healthy and avoid high-fat foods.  Mood: Taking Wellbutrin 300 mg daily and BuSpar 10 mg twice daily as needed.  Feels the medications are working well for mood control and is happy with the regimen.  No side effects or concerns.  Denies SI/HI.  GERD: Taking Protonix 40 mg daily, tolerating well without side effects.  Has some concerns on being on this medication long-term but was told by her GI provider that it would be fine.  Has not tried a lower dose to see if this continues to manage her reflux well.  Genital herpes: Taking Valtrex 500 mg daily, tolerating well without side effects.  Has had no recent outbreaks.  Prediabetes: Taking metformin 500 mg daily, tolerating well although she does have some mild GI side effects.  Does not check sugars at home.  Trying to make healthier choices but does occasionally indulge in cravings for specific foods.   Objective:    Vitals:   03/18/22 0933  BP: 123/80  Pulse: (!) 103  Resp: 20  Height: 5\' 5"  (1.651 m)  Weight: 160 lb 8 oz (72.8 kg)  SpO2: 100%  BMI (Calculated): 26.71    Physical Exam Vitals reviewed.  Constitutional:      General: She is not in acute distress.    Appearance: Normal  appearance. She is not ill-appearing.  HENT:     Head: Normocephalic and atraumatic.  Cardiovascular:     Rate and Rhythm: Normal rate and regular rhythm.     Pulses: Normal pulses.     Heart sounds: Normal heart sounds.  Pulmonary:     Effort: Pulmonary effort is normal. No respiratory distress.     Breath sounds: Normal breath sounds. No wheezing, rhonchi or rales.  Skin:    General: Skin is warm and dry.     Findings: Bruising: agm.  Neurological:     Mental Status: She is alert and oriented to person, place, and time.  Psychiatric:        Mood and Affect: Mood normal.        Behavior: Behavior normal.        Thought Content: Thought content normal.        Judgment: Judgment normal.   No results found for this or any previous visit (from the past 24 hour(s)).     The 10-year ASCVD risk score (Arnett DK, et al., 2019) is: 7.4%   Values used to calculate the score:     Age: 69 years     Sex: Female     Is Non-Hispanic African American: Yes     Diabetic: No     Tobacco smoker: No     Systolic Blood Pressure: 585 mmHg     Is BP treated: Yes     HDL Cholesterol: 52 mg/dL  Total Cholesterol: 185 mg/dL   Assessment & Plan:   1. Prediabetes Checking hemoglobin A1c.  Continue metformin XR 500 mg daily. - Hemoglobin A1c  2. Essential hypertension Checking labs as below.  Blood pressure at goal today.  Continue olmesartan-hydrochlorothiazide 20-12.5 mg daily.  Monitor blood pressure at home with a goal of 130/80 or less.  Low-sodium diet, regular intentional exercise, and maintenance of a healthy weight recommended. - CBC with Differential/Platelet - COMPLETE METABOLIC PANEL WITH GFR - Lipid panel  3. Mixed hyperlipidemia Checking lipids today.  Continue Lipitor 20 mg daily. - atorvastatin (LIPITOR) 20 MG tablet; TAKE 1 TABLET(20 MG) BY MOUTH DAILY  Dispense: 90 tablet; Refill: 3  4. Anxiety and depression Symptoms stable and well-controlled.  Continue Wellbutrin and  BuSpar as prescribed.  5. Gastroesophageal reflux disease, unspecified whether esophagitis present Discussed the risk of long-term treatment with PPIs versus the risk of untreated reflux.  She would like to try going down on her dose so sending in Protonix 20 mg daily.  Advised her to try this for several weeks and if this is not enough to keep her symptoms controlled, we will go back up to 40 mg daily.  6. Genital herpes simplex, unspecified site Stable and well-controlled.  Continue Valtrex 500 mg daily. - valACYclovir (VALTREX) 500 MG tablet; Take 1 tablet (500 mg total) by mouth daily.  Dispense: 90 tablet; Refill: 3  7. Stage 3a chronic kidney disease ([place]) Checking renal function today.  Return in about 6 months (around 09/16/2022) for chronic disease follow up.  ___________________________________________ Clearnce Sorrel, DNP, APRN, FNP-BC Primary Care and [place]

## 2022-03-19 LAB — COMPLETE METABOLIC PANEL WITH GFR
AG Ratio: 1.4 (calc) (ref 1.0–2.5)
ALT: 12 U/L (ref 6–29)
AST: 14 U/L (ref 10–35)
Albumin: 4.2 g/dL (ref 3.6–5.1)
Alkaline phosphatase (APISO): 63 U/L (ref 37–153)
BUN/Creatinine Ratio: 12 (calc) (ref 6–22)
BUN: 14 mg/dL (ref 7–25)
CO2: 31 mmol/L (ref 20–32)
Calcium: 9.8 mg/dL (ref 8.6–10.4)
Chloride: 101 mmol/L (ref 98–110)
Creat: 1.14 mg/dL — ABNORMAL HIGH (ref 0.50–1.05)
Globulin: 3 g/dL (calc) (ref 1.9–3.7)
Glucose, Bld: 95 mg/dL (ref 65–99)
Potassium: 3.5 mmol/L (ref 3.5–5.3)
Sodium: 140 mmol/L (ref 135–146)
Total Bilirubin: 0.4 mg/dL (ref 0.2–1.2)
Total Protein: 7.2 g/dL (ref 6.1–8.1)
eGFR: 54 mL/min/{1.73_m2} — ABNORMAL LOW (ref >=60)

## 2022-03-19 LAB — CBC WITH DIFFERENTIAL/PLATELET
Absolute Monocytes: 461 cells/uL (ref 200–950)
Basophils Absolute: 42 cells/uL (ref 0–200)
Basophils Relative: 0.8 %
Eosinophils Absolute: 32 cells/uL (ref 15–500)
Eosinophils Relative: 0.6 %
HCT: 35.9 % (ref 35.0–45.0)
Hemoglobin: 12.3 g/dL (ref 11.7–15.5)
Lymphs Abs: 1426 cells/uL (ref 850–3900)
MCH: 31.7 pg (ref 27.0–33.0)
MCHC: 34.3 g/dL (ref 32.0–36.0)
MCV: 92.5 fL (ref 80.0–100.0)
MPV: 10.8 fL (ref 7.5–12.5)
Monocytes Relative: 8.7 %
Neutro Abs: 3339 cells/uL (ref 1500–7800)
Neutrophils Relative %: 63 %
Platelets: 264 10*3/uL (ref 140–400)
RBC: 3.88 10*6/uL (ref 3.80–5.10)
RDW: 13.6 % (ref 11.0–15.0)
Total Lymphocyte: 26.9 %
WBC: 5.3 10*3/uL (ref 3.8–10.8)

## 2022-03-19 LAB — HEMOGLOBIN A1C
Hgb A1c MFr Bld: 6.3 % of total Hgb — ABNORMAL HIGH (ref <5.7)
Mean Plasma Glucose: 134 mg/dL
eAG (mmol/L): 7.4 mmol/L

## 2022-03-19 LAB — LIPID PANEL
Cholesterol: 145 mg/dL (ref <200)
HDL: 43 mg/dL — ABNORMAL LOW (ref >=50)
LDL Cholesterol (Calc): 80 mg/dL (calc)
Non-HDL Cholesterol (Calc): 102 mg/dL (calc) (ref <130)
Total CHOL/HDL Ratio: 3.4 (calc) (ref <5.0)
Triglycerides: 127 mg/dL (ref <150)

## 2022-03-20 ENCOUNTER — Encounter: Payer: Self-pay | Admitting: Medical-Surgical

## 2022-04-21 ENCOUNTER — Other Ambulatory Visit: Payer: Self-pay | Admitting: Medical-Surgical

## 2022-05-24 ENCOUNTER — Other Ambulatory Visit: Payer: Self-pay | Admitting: Medical-Surgical

## 2022-05-31 ENCOUNTER — Other Ambulatory Visit: Payer: Self-pay | Admitting: Medical-Surgical

## 2022-06-14 ENCOUNTER — Encounter: Payer: Self-pay | Admitting: Medical-Surgical

## 2022-06-15 ENCOUNTER — Other Ambulatory Visit: Payer: Self-pay | Admitting: Medical-Surgical

## 2022-06-15 MED ORDER — PANTOPRAZOLE SODIUM 40 MG PO TBEC: 40.0000 mg | DELAYED_RELEASE_TABLET | Freq: Every day | ORAL | 1 refills | Status: DC

## 2022-07-14 ENCOUNTER — Ambulatory Visit: Payer: BC Managed Care – PPO | Admitting: Medical-Surgical

## 2022-08-18 ENCOUNTER — Encounter: Payer: Self-pay | Admitting: Medical-Surgical

## 2022-08-19 MED ORDER — BUSPIRONE HCL 10 MG PO TABS: ORAL_TABLET | ORAL | 1 refills | Status: DC

## 2022-08-30 ENCOUNTER — Other Ambulatory Visit: Payer: Self-pay | Admitting: Medical-Surgical

## 2022-08-31 MED ORDER — OLMESARTAN MEDOXOMIL-HCTZ 20-12.5 MG PO TABS: 1.0000 | ORAL_TABLET | Freq: Every day | ORAL | 0 refills | Status: DC

## 2022-09-13 ENCOUNTER — Ambulatory Visit: Payer: BC Managed Care – PPO | Admitting: Medical-Surgical

## 2022-10-10 ENCOUNTER — Encounter: Payer: Self-pay | Admitting: Medical-Surgical

## 2022-10-10 ENCOUNTER — Ambulatory Visit: Payer: Medicare PPO | Admitting: Medical-Surgical

## 2022-10-10 VITALS — BP 124/85 | HR 84 | Ht 65.0 in | Wt 155.0 lb

## 2022-10-10 DIAGNOSIS — I10 Essential (primary) hypertension: Secondary | ICD-10-CM | POA: Diagnosis not present

## 2022-10-10 DIAGNOSIS — A6000 Herpesviral infection of urogenital system, unspecified: Secondary | ICD-10-CM | POA: Diagnosis not present

## 2022-10-10 DIAGNOSIS — Z780 Asymptomatic menopausal state: Secondary | ICD-10-CM | POA: Diagnosis not present

## 2022-10-10 DIAGNOSIS — R7303 Prediabetes: Secondary | ICD-10-CM

## 2022-10-10 DIAGNOSIS — N1831 Chronic kidney disease, stage 3a: Secondary | ICD-10-CM

## 2022-10-10 DIAGNOSIS — F32A Depression, unspecified: Secondary | ICD-10-CM

## 2022-10-10 DIAGNOSIS — Z23 Encounter for immunization: Secondary | ICD-10-CM

## 2022-10-10 DIAGNOSIS — F419 Anxiety disorder, unspecified: Secondary | ICD-10-CM

## 2022-10-10 DIAGNOSIS — E782 Mixed hyperlipidemia: Secondary | ICD-10-CM

## 2022-10-10 DIAGNOSIS — K219 Gastro-esophageal reflux disease without esophagitis: Secondary | ICD-10-CM

## 2022-10-10 LAB — POCT GLYCOSYLATED HEMOGLOBIN (HGB A1C): Hemoglobin A1C: 5.9 % — AB (ref 4.0–5.6)

## 2022-10-10 MED ORDER — OLMESARTAN MEDOXOMIL-HCTZ 20-12.5 MG PO TABS: 1.0000 | ORAL_TABLET | Freq: Every day | ORAL | 0 refills | Status: DC

## 2022-10-10 MED ORDER — PANTOPRAZOLE SODIUM 40 MG PO TBEC: 40.0000 mg | DELAYED_RELEASE_TABLET | Freq: Every day | ORAL | 1 refills | Status: DC

## 2022-10-10 NOTE — Progress Notes (Addendum)
        Established patient visit  History, exam, impression, and plan:  1. Essential hypertension Pleasant 67 year old female presenting today with a history of hypertension currently managed with olmesartan-HCTZ 20-12.5 mg daily.  Taking her medication as prescribed, tolerating well without missed doses.  No significant side effects.  Not monitoring blood pressure at home.  Following a low-sodium diet.  Has started exercising with the Silver sneakers program.  Denies concerning symptoms today.  Cardiopulmonary exam normal.  Checking labs as below.  Blood pressure at goal.  Continue olmesartan-HCTZ as prescribed. - CBC with Differential/Platelet - Comprehensive metabolic panel - Lipid panel  2. Prediabetes History of prediabetes at 6.3% earlier this year.  Taking metformin XR 500 mg daily, tolerating well without side effects.  Has a glucometer at home but does not regularly check her sugars.  Recheck of A1c 5.9% today.  Continue metformin and low carbohydrate diet. - POCT HgB A1C  3. Postmenopausal Discussed recommendations for osteoporosis screening.  Ordering DEXA scan today. - DG Bone Density; Future  4. Genital herpes simplex, unspecified site Long-term treatment with Valtrex 500 mg daily for suppressive therapy.  No recent outbreaks and feels the medication works very well.  Continue Valtrex as prescribed.  5. Mixed hyperlipidemia Taking atorvastatin 20 mg daily, tolerating well without side effects.  Following a low-fat heart healthy diet.  Working on increased exercise and as noted above.  Continue Lipitor as prescribed.  6. Gastroesophageal reflux disease, unspecified whether esophagitis present Has been treated with pantoprazole 40 mg daily long-term.  Was followed by a gastroenterologist who reports that her lower esophageal sphincter does not close completely and recommended continuing Protonix after discussion of risks versus benefits.  Refilling today.  7. Anxiety and  depression Taking Wellbutrin 300 mg daily, tolerating well without side effects.  Feels this is working to keep her mood very stable.  No longer needs to use BuSpar for breakthrough anxiety.  During exam, mood, thought pattern, cognition, speech, and affect all normal.  Denies SI/HI.  Continue Wellbutrin as prescribed.  8. Stage 3a chronic kidney disease (HCC) Rechecking renal function today.  9. Need for pneumococcal 20-valent conjugate vaccination Prevnar 20 given in office today. - Pneumococcal conjugate vaccine 20-valent (Prevnar 20)  10. Need for influenza vaccination Flu vaccine given in office today.  Procedures performed this visit: None.  Return in about 6 months (around 04/12/2023) for chronic disease follow up.  __________________________________ Thayer Ohm, DNP, APRN, FNP-BC Primary Care and Sports Medicine [facility] [place]

## 2022-10-25 NOTE — Addendum Note (Signed)
Addended by: Rae Halsted on: 10/25/2022 01:35 PM   Modules accepted: Orders

## 2022-10-26 ENCOUNTER — Encounter: Payer: Self-pay | Admitting: Medical-Surgical

## 2022-10-26 ENCOUNTER — Ambulatory Visit (INDEPENDENT_AMBULATORY_CARE_PROVIDER_SITE_OTHER): Payer: Medicare PPO

## 2022-10-26 DIAGNOSIS — Z780 Asymptomatic menopausal state: Secondary | ICD-10-CM | POA: Diagnosis not present

## 2022-11-16 DIAGNOSIS — M6289 Other specified disorders of muscle: Secondary | ICD-10-CM | POA: Insufficient documentation

## 2022-11-16 DIAGNOSIS — R159 Full incontinence of feces: Secondary | ICD-10-CM | POA: Insufficient documentation

## 2022-11-28 ENCOUNTER — Other Ambulatory Visit: Payer: Self-pay | Admitting: Medical-Surgical

## 2022-11-29 ENCOUNTER — Telehealth: Payer: Self-pay | Admitting: Medical-Surgical

## 2022-11-29 DIAGNOSIS — Z1231 Encounter for screening mammogram for malignant neoplasm of breast: Secondary | ICD-10-CM | POA: Diagnosis not present

## 2022-11-29 DIAGNOSIS — R92333 Mammographic heterogeneous density, bilateral breasts: Secondary | ICD-10-CM | POA: Diagnosis not present

## 2022-11-29 LAB — HM MAMMOGRAPHY

## 2022-11-29 NOTE — Telephone Encounter (Signed)
Patient called in regards to message sent in my chart requesting a refill on olmesartan-hydrochlorothiazide 20-12.5 mg tablet states received a message back saying it was denied please let clarify  Pharmacy is [facility] on [address] [place]  Phone [phone]

## 2022-11-29 NOTE — Telephone Encounter (Signed)
I messaged the patient through MyChart advising that she had this medication filled 10/10/2022 for 90 days supply.

## 2022-12-01 ENCOUNTER — Other Ambulatory Visit: Payer: Self-pay | Admitting: Medical-Surgical

## 2022-12-01 DIAGNOSIS — F32A Depression, unspecified: Secondary | ICD-10-CM

## 2022-12-05 ENCOUNTER — Encounter: Payer: Self-pay | Admitting: Medical-Surgical

## 2022-12-06 ENCOUNTER — Encounter: Payer: Self-pay | Admitting: Medical-Surgical

## 2022-12-11 ENCOUNTER — Other Ambulatory Visit: Payer: Self-pay | Admitting: Medical-Surgical

## 2023-02-16 ENCOUNTER — Encounter: Payer: Self-pay | Admitting: Medical-Surgical

## 2023-02-16 ENCOUNTER — Other Ambulatory Visit: Payer: Self-pay | Admitting: Medical-Surgical

## 2023-02-16 DIAGNOSIS — F419 Anxiety disorder, unspecified: Secondary | ICD-10-CM

## 2023-02-16 MED ORDER — BUPROPION HCL ER (XL) 300 MG PO TB24
ORAL_TABLET | ORAL | 0 refills | Status: DC
Start: 2023-02-16 — End: 2023-04-12

## 2023-02-16 MED ORDER — OLMESARTAN MEDOXOMIL-HCTZ 20-12.5 MG PO TABS: 1.0000 | ORAL_TABLET | Freq: Every day | ORAL | 0 refills | Status: DC

## 2023-02-28 ENCOUNTER — Telehealth (HOSPITAL_BASED_OUTPATIENT_CLINIC_OR_DEPARTMENT_OTHER): Payer: Self-pay

## 2023-02-28 NOTE — Telephone Encounter (Signed)
Attempted to call pt in regards to upcoming appt for pre-charting and collecting medical history from pt.

## 2023-03-06 ENCOUNTER — Encounter (HOSPITAL_BASED_OUTPATIENT_CLINIC_OR_DEPARTMENT_OTHER): Payer: Medicare PPO | Admitting: Certified Nurse Midwife

## 2023-03-06 ENCOUNTER — Encounter (HOSPITAL_BASED_OUTPATIENT_CLINIC_OR_DEPARTMENT_OTHER): Payer: Self-pay

## 2023-03-14 ENCOUNTER — Ambulatory Visit (HOSPITAL_BASED_OUTPATIENT_CLINIC_OR_DEPARTMENT_OTHER): Payer: Medicare PPO | Admitting: Certified Nurse Midwife

## 2023-03-14 ENCOUNTER — Encounter (HOSPITAL_BASED_OUTPATIENT_CLINIC_OR_DEPARTMENT_OTHER): Payer: Self-pay | Admitting: Certified Nurse Midwife

## 2023-03-14 ENCOUNTER — Other Ambulatory Visit (HOSPITAL_COMMUNITY)
Admission: RE | Admit: 2023-03-14 | Discharge: 2023-03-14 | Disposition: A | Discharge status: Home / Self Care | Payer: Medicare PPO | Source: Ambulatory Visit | Attending: Certified Nurse Midwife | Admitting: Certified Nurse Midwife

## 2023-03-14 VITALS — BP 124/85 | HR 88 | Ht 65.0 in | Wt 158.6 lb

## 2023-03-14 DIAGNOSIS — R150 Incomplete defecation: Secondary | ICD-10-CM

## 2023-03-14 DIAGNOSIS — Z1231 Encounter for screening mammogram for malignant neoplasm of breast: Secondary | ICD-10-CM | POA: Diagnosis not present

## 2023-03-14 DIAGNOSIS — R159 Full incontinence of feces: Secondary | ICD-10-CM

## 2023-03-14 DIAGNOSIS — Z01411 Encounter for gynecological examination (general) (routine) with abnormal findings: Secondary | ICD-10-CM | POA: Diagnosis not present

## 2023-03-14 DIAGNOSIS — Z124 Encounter for screening for malignant neoplasm of cervix: Secondary | ICD-10-CM

## 2023-03-14 DIAGNOSIS — H00025 Hordeolum internum left lower eyelid: Secondary | ICD-10-CM | POA: Diagnosis not present

## 2023-03-14 DIAGNOSIS — Z1151 Encounter for screening for human papillomavirus (HPV): Secondary | ICD-10-CM | POA: Insufficient documentation

## 2023-03-14 DIAGNOSIS — R151 Fecal smearing: Secondary | ICD-10-CM | POA: Diagnosis not present

## 2023-03-14 DIAGNOSIS — N952 Postmenopausal atrophic vaginitis: Secondary | ICD-10-CM | POA: Diagnosis not present

## 2023-03-14 DIAGNOSIS — Z01419 Encounter for gynecological examination (general) (routine) without abnormal findings: Secondary | ICD-10-CM | POA: Diagnosis present

## 2023-03-14 DIAGNOSIS — R87610 Atypical squamous cells of undetermined significance on cytologic smear of cervix (ASC-US): Secondary | ICD-10-CM | POA: Diagnosis not present

## 2023-03-14 MED ORDER — ESTRADIOL 0.1 MG/GM VA CREA
TOPICAL_CREAM | VAGINAL | 6 refills | Status: DC
Start: 2023-03-14 — End: 2023-07-13

## 2023-03-14 NOTE — Progress Notes (Signed)
 Followed by [facility] for fecal soiling,  a sense of incomplete evacuation and hemorrhoidal swelling. She describes difficulty in cleaning after BM. Sits on toilet, has a normal bowel movement, gets up/moves around and then will have stool present between buttocks.  She denies bleeding and does not have frank incontinence. She [facility] and has had some confirmation of pelvic floor dyssynergy. She had a normal colonoscopy 2023  last year. Stools are typically [place] 3-5. She denies straining.   68 y.o. [id number] Divorced Black or African American female here for annual exam.    No LMP recorded. Patient is postmenopausal.          Exercising: Yes.     Smoker:  no  Health Maintenance: Pap:  Last pap unsure History of abnormal Pap:  no MMG:  2023 Colonoscopy:  2023 BMD:   10/26/22 Osteopenia Screening Labs: by PCP   reports that she has never smoked. She has never used smokeless tobacco. She reports that she does not currently use alcohol. She reports that she does not use drugs.  Past Medical History:  Diagnosis Date   Anxiety    GERD (gastroesophageal reflux disease)    High blood pressure    High cholesterol     History reviewed. No pertinent surgical history.  Current Outpatient Medications  Medication Sig Dispense Refill   atorvastatin  (LIPITOR) 20 MG tablet TAKE 1 TABLET(20 MG) BY MOUTH DAILY 90 tablet 3   blood glucose meter kit and supplies KIT Dispense based on patient and insurance preference. Use up to four times daily as directed. Please include lancets, test strips, control solution. 1 each 0   buPROPion  (WELLBUTRIN  XL) 300 MG 24 hr tablet TAKE 1 TABLET BY MOUTH EVERY MORNING 90 tablet 0   estradiol  (ESTRACE ) 0.1 MG/GM vaginal cream 2 grams vaginally at bedtime for 2 weeks then twice weekly 85 g 6   hydrocortisone (ANUSOL-HC) 2.5 % rectal cream Place rectally.     metFORMIN  (GLUCOPHAGE  XR) 500 MG 24 hr tablet Take 1 tablet (500 mg total) by  mouth daily with breakfast. 90 tablet 3   Multiple Vitamins-Calcium  (ONE-A-DAY WOMENS PO) Take by mouth.     olmesartan -hydrochlorothiazide  (BENICAR  HCT) 20-12.5 MG tablet Take 1 tablet by mouth daily. 90 tablet 0   pantoprazole  (PROTONIX ) 40 MG tablet Take 1 tablet (40 mg total) by mouth daily. 90 tablet 1   valACYclovir  (VALTREX ) 500 MG tablet Take 1 tablet (500 mg total) by mouth daily. 90 tablet 3   No current facility-administered medications for this visit.    Family History  Problem Relation Age of Onset   Diabetes Mother    High Cholesterol Mother    High blood pressure Mother    Aortic aneurysm Mother    High blood pressure Father    Anuerysm Sister    Multiple sclerosis Sister     ROS: Constitutional: negative for anorexia, chills, fatigue, and fevers Genitourinary:positive for fecal smearing, incomplete emptying of stool after BM  Exam:   BP 124/85 (BP Location: Right Arm, Patient Position: Sitting, Cuff Size: Normal)   Pulse 88   Ht 5' 5 (1.651 m)   Wt 158 lb 9.6 oz (71.9 kg)   BMI 26.39 kg/m   Height: 5' 5 (165.1 cm)  General appearance: alert, cooperative and appears stated age Head: Normocephalic, without obvious abnormality, atraumatic Breasts: normal appearance, no masses or tenderness, Inspection negative, No nipple retraction or dimpling, No nipple discharge or bleeding, No  axillary or supraclavicular adenopathy, Normal to palpation without dominant masses Heart: regular rate and rhythm Abdomen: soft, non-tender; bowel sounds normal; no masses,  no organomegaly Extremities: extremities normal, atraumatic, no cyanosis or edema Skin: Skin color, texture, turgor normal. No rashes or lesions Lymph nodes: Cervical, supraclavicular, and axillary nodes normal. No abnormal inguinal nodes palpated Neurologic: Grossly normal   Pelvic: External genitalia:  no lesions              Urethra:  normal appearing urethra with no masses, tenderness or lesions               Bartholins and Skenes: normal                 Vagina: atrophic appearing (thin/pale)  vagina and no discharge, no lesions              Cervix: no lesions              Pap taken: Yes.   Bimanual Exam:  Uterus:  normal              Adnexa: no mass, fullness, tenderness               Rectovaginal: Confirms               Anus:  normal sphincter tone, no lesions  Chaperone, Tempie Chancy, CMA, was present for exam.  Assessment/Plan:  1. Encounter for gynecological examination with abnormal finding   2. Encounter for screening mammogram for malignant neoplasm of breast (Primary) - MM 3D SCREENING MAMMOGRAM BILATERAL BREAST; Future  3. Vaginal atrophy - estradiol  (ESTRACE ) 0.1 MG/GM vaginal cream; 2 grams vaginally at bedtime for 2 weeks then twice weekly  Dispense: 85 g; Refill: 6  4. Fecal smearing - Pt open to trying Pelvic Floor PT again - Ambulatory referral to Urogynecology  5. Fecal incontinence with incomplete defecation - Ambulatory referral to Urogynecology  6. Cervical cancer screening - Cytology - PAP( [place])   Pt will consider Pelvic Floor Physical Therapy and evaluation by Dr. Rosaline Caper (fecal smearing). RTO 1 year for annual gyn exam and prn if issues arise.[place] MARLA Roller

## 2023-03-16 ENCOUNTER — Encounter (HOSPITAL_BASED_OUTPATIENT_CLINIC_OR_DEPARTMENT_OTHER): Payer: Self-pay | Admitting: Certified Nurse Midwife

## 2023-03-16 LAB — CYTOLOGY - PAP
Comment: NEGATIVE
Diagnosis: UNDETERMINED — AB
High risk HPV: NEGATIVE

## 2023-03-17 ENCOUNTER — Encounter (HOSPITAL_BASED_OUTPATIENT_CLINIC_OR_DEPARTMENT_OTHER): Payer: Self-pay | Admitting: Certified Nurse Midwife

## 2023-04-03 ENCOUNTER — Encounter: Payer: Self-pay | Admitting: Medical-Surgical

## 2023-04-03 DIAGNOSIS — A6000 Herpesviral infection of urogenital system, unspecified: Secondary | ICD-10-CM

## 2023-04-04 ENCOUNTER — Other Ambulatory Visit: Payer: Self-pay

## 2023-04-04 DIAGNOSIS — A6000 Herpesviral infection of urogenital system, unspecified: Secondary | ICD-10-CM

## 2023-04-04 MED ORDER — VALACYCLOVIR HCL 500 MG PO TABS: 500.0000 mg | ORAL_TABLET | Freq: Every day | ORAL | 0 refills | Status: DC

## 2023-04-04 NOTE — Telephone Encounter (Signed)
Patient requesting rx rf of valtrex  Valtrex - last written 03/18/2022 Last OV 10/10/2022 Upcoming appt 04/12/2023

## 2023-04-05 DIAGNOSIS — R159 Full incontinence of feces: Secondary | ICD-10-CM | POA: Diagnosis not present

## 2023-04-05 DIAGNOSIS — K5902 Outlet dysfunction constipation: Secondary | ICD-10-CM | POA: Diagnosis not present

## 2023-04-11 DIAGNOSIS — K5902 Outlet dysfunction constipation: Secondary | ICD-10-CM | POA: Diagnosis not present

## 2023-04-11 DIAGNOSIS — R159 Full incontinence of feces: Secondary | ICD-10-CM | POA: Diagnosis not present

## 2023-04-11 DIAGNOSIS — M6289 Other specified disorders of muscle: Secondary | ICD-10-CM | POA: Diagnosis not present

## 2023-04-11 DIAGNOSIS — R151 Fecal smearing: Secondary | ICD-10-CM | POA: Diagnosis not present

## 2023-04-12 ENCOUNTER — Encounter: Payer: Self-pay | Admitting: Medical-Surgical

## 2023-04-12 ENCOUNTER — Ambulatory Visit: Payer: Medicare PPO | Admitting: Medical-Surgical

## 2023-04-12 VITALS — BP 119/77 | HR 91 | Resp 20 | Ht 65.0 in | Wt 157.2 lb

## 2023-04-12 DIAGNOSIS — F419 Anxiety disorder, unspecified: Secondary | ICD-10-CM | POA: Diagnosis not present

## 2023-04-12 DIAGNOSIS — R151 Fecal smearing: Secondary | ICD-10-CM | POA: Diagnosis not present

## 2023-04-12 DIAGNOSIS — E782 Mixed hyperlipidemia: Secondary | ICD-10-CM

## 2023-04-12 DIAGNOSIS — R7303 Prediabetes: Secondary | ICD-10-CM

## 2023-04-12 DIAGNOSIS — N1831 Chronic kidney disease, stage 3a: Secondary | ICD-10-CM

## 2023-04-12 DIAGNOSIS — F32A Depression, unspecified: Secondary | ICD-10-CM

## 2023-04-12 DIAGNOSIS — A6000 Herpesviral infection of urogenital system, unspecified: Secondary | ICD-10-CM

## 2023-04-12 DIAGNOSIS — I10 Essential (primary) hypertension: Secondary | ICD-10-CM

## 2023-04-12 DIAGNOSIS — M6289 Other specified disorders of muscle: Secondary | ICD-10-CM | POA: Diagnosis not present

## 2023-04-12 DIAGNOSIS — K219 Gastro-esophageal reflux disease without esophagitis: Secondary | ICD-10-CM

## 2023-04-12 LAB — POCT GLYCOSYLATED HEMOGLOBIN (HGB A1C)
HbA1c, POC (prediabetic range): 6 % (ref 5.7–6.4)
Hemoglobin A1C: 6 % — AB (ref 4.0–5.6)

## 2023-04-12 MED ORDER — OLMESARTAN MEDOXOMIL-HCTZ 20-12.5 MG PO TABS: 1.0000 | ORAL_TABLET | Freq: Every day | ORAL | 3 refills | Status: AC

## 2023-04-12 MED ORDER — VALACYCLOVIR HCL 500 MG PO TABS: 500.0000 mg | ORAL_TABLET | Freq: Every day | ORAL | 3 refills | Status: AC

## 2023-04-12 MED ORDER — METFORMIN HCL ER 500 MG PO TB24: 500.0000 mg | ORAL_TABLET | Freq: Every day | ORAL | 3 refills | Status: DC

## 2023-04-12 MED ORDER — PANTOPRAZOLE SODIUM 40 MG PO TBEC: 40.0000 mg | DELAYED_RELEASE_TABLET | Freq: Every day | ORAL | 3 refills | Status: DC

## 2023-04-12 MED ORDER — BUPROPION HCL ER (XL) 300 MG PO TB24: ORAL_TABLET | ORAL | 3 refills | Status: AC

## 2023-04-12 MED ORDER — ATORVASTATIN CALCIUM 20 MG PO TABS: ORAL_TABLET | ORAL | 3 refills | Status: DC

## 2023-04-12 NOTE — Progress Notes (Unsigned)
        Established patient visit  History, exam, impression, and plan:  1. Essential hypertension (Primary) Pleasant 68 year old female presenting today with a history of HTN currently managed with olmesartan-hydrochlorothiazide 20-12.5mg  daily. Tolerating the medication well without side effects. Not regularly checking BP. Follows a low sodium diet. Denies concerning symptoms. Cardiopulmonary exam normal. BP stable. Checking labs. Continue olmesartan-hydrochlorothiazide as prescribed. - CBC with Differential/Platelet - CMP14+EGFR  2. Prediabetes History of prediabetes with an A1c that has hovered in the low 6% range. Has been working on eating healthy options but does admit to liking her sweets. Recheck of A1c today at 6..0%, holding stead. Has been taking Metformin 500mg  every morning. Plan to continue as prescribed.  - POCT HgB A1C  3. Anxiety and depression Taking Wellbutrin 300mg  daily, tolerating well without side effects. Feels that the medication works well and keeps her mood stable.  Denies SI/HI.  Continue Wellbutrin as prescribed. - buPROPion (WELLBUTRIN XL) 300 MG 24 hr tablet; TAKE 1 TABLET BY MOUTH EVERY MORNING  Dispense: 90 tablet; Refill: 3  4. Mixed hyperlipidemia History of mixed hyperlipidemia currently treated with atorvastatin 20 mg daily, tolerating well without side effects.  Cognizant of following a low-fat heart healthy diet and works to stay very active.  Plan to check labs today.  Continue atorvastatin 20 mg daily as prescribed. - CMP14+EGFR - atorvastatin (LIPITOR) 20 MG tablet; TAKE 1 TABLET(20 MG) BY MOUTH DAILY  Dispense: 90 tablet; Refill: 3  5. Genital herpes simplex, unspecified site She does have a history of genital herpes that is treated with Valtrex 500 mg daily for suppression.  No recent outbreaks or concerns.  Tolerates the medication well without side effects.  Plan to continue Valtrex as ordered. - valACYclovir (VALTREX) 500 MG tablet; Take 1  tablet (500 mg total) by mouth daily.  Dispense: 90 tablet; Refill: 3  6. Stage 3a chronic kidney disease (HCC) History of stage III chronic kidney disease with GFR in the low 50s.  Stays very well-hydrated and avoids NSAIDs except for occasional doses.  Rechecking labs today. - CBC with Differential/Platelet - CMP14+EGFR  7. Gastroesophageal reflux disease, unspecified whether esophagitis present Long history of GERD that is currently well-managed with Protonix 40 mg daily.  Has tried to come off the medication in the past but this was not tolerated.  Aware of the risks of PPI treatment long-term.  Continue Protonix as prescribed.  8. Fecal smearing 9. Pelvic floor dysfunction Continues to have significant concerns due to fecal smearing/incontinence as well as pelvic floor dysfunction.  She has been to see gastroenterology and OB/GYN.  She is now seeing urogynecology for further recommendations.  Has done pelvic physical therapy in the past which seem to help with her symptoms but admits that she has not been doing the exercises regularly since then.  No further interventions today but recommend she does follow-up with  urogynecology as scheduled.   Procedures performed this visit: None.  Return in about 6 months (around 10/10/2023) for chronic disease follow up.  __________________________________ Thayer Ohm, DNP, APRN, FNP-BC Primary Care and Sports Medicine [facility] [place]

## 2023-04-13 ENCOUNTER — Encounter: Payer: Self-pay | Admitting: Medical-Surgical

## 2023-04-13 ENCOUNTER — Ambulatory Visit: Payer: Medicare PPO | Admitting: Medical-Surgical

## 2023-04-13 DIAGNOSIS — N289 Disorder of kidney and ureter, unspecified: Secondary | ICD-10-CM

## 2023-04-13 LAB — CBC WITH DIFFERENTIAL/PLATELET
Basophils Absolute: 0 10*3/uL (ref 0.0–0.2)
Basos: 1 %
EOS (ABSOLUTE): 0.1 10*3/uL (ref 0.0–0.4)
Eos: 2 %
Hematocrit: 36.1 % (ref 34.0–46.6)
Hemoglobin: 12 g/dL (ref 11.1–15.9)
Immature Grans (Abs): 0 10*3/uL (ref 0.0–0.1)
Immature Granulocytes: 0 %
Lymphocytes Absolute: 1.8 10*3/uL (ref 0.7–3.1)
Lymphs: 35 %
MCH: 30.5 pg (ref 26.6–33.0)
MCHC: 33.2 g/dL (ref 31.5–35.7)
MCV: 92 fL (ref 79–97)
Monocytes Absolute: 0.6 10*3/uL (ref 0.1–0.9)
Monocytes: 11 %
Neutrophils Absolute: 2.7 10*3/uL (ref 1.4–7.0)
Neutrophils: 51 %
Platelets: 273 10*3/uL (ref 150–450)
RBC: 3.94 x10E6/uL (ref 3.77–5.28)
RDW: 13.1 % (ref 11.7–15.4)
WBC: 5.3 10*3/uL (ref 3.4–10.8)

## 2023-04-13 LAB — CMP14+EGFR
ALT: 15 [IU]/L (ref 0–32)
AST: 15 [IU]/L (ref 0–40)
Albumin: 4.3 g/dL (ref 3.9–4.9)
Alkaline Phosphatase: 84 [IU]/L (ref 44–121)
BUN/Creatinine Ratio: 8 — ABNORMAL LOW (ref 12–28)
BUN: 10 mg/dL (ref 8–27)
Bilirubin Total: 0.3 mg/dL (ref 0.0–1.2)
CO2: 23 mmol/L (ref 20–29)
Calcium: 10 mg/dL (ref 8.7–10.3)
Chloride: 101 mmol/L (ref 96–106)
Creatinine, Ser: 1.22 mg/dL — ABNORMAL HIGH (ref 0.57–1.00)
Globulin, Total: 2.4 g/dL (ref 1.5–4.5)
Glucose: 83 mg/dL (ref 70–99)
Potassium: 4 mmol/L (ref 3.5–5.2)
Sodium: 143 mmol/L (ref 134–144)
Total Protein: 6.7 g/dL (ref 6.0–8.5)
eGFR: 49 mL/min/{1.73_m2} — ABNORMAL LOW (ref >59)

## 2023-04-17 NOTE — Addendum Note (Signed)
Addended byChristen Butter on: 04/17/2023 07:31 AM   Modules accepted: Orders

## 2023-06-02 ENCOUNTER — Ambulatory Visit: Payer: Medicare PPO | Admitting: Physical Therapy

## 2023-06-09 ENCOUNTER — Encounter: Payer: Medicare PPO | Admitting: Physical Therapy

## 2023-06-12 ENCOUNTER — Ambulatory Visit: Payer: Medicare PPO | Admitting: Obstetrics

## 2023-06-15 ENCOUNTER — Ambulatory Visit: Payer: Medicare PPO | Admitting: Physical Therapy

## 2023-06-19 ENCOUNTER — Encounter: Payer: Self-pay | Admitting: Medical-Surgical

## 2023-06-19 DIAGNOSIS — K130 Diseases of lips: Secondary | ICD-10-CM

## 2023-06-19 DIAGNOSIS — Z1283 Encounter for screening for malignant neoplasm of skin: Secondary | ICD-10-CM

## 2023-06-23 ENCOUNTER — Encounter: Payer: Self-pay | Admitting: Medical-Surgical

## 2023-06-23 ENCOUNTER — Encounter: Payer: Medicare PPO | Admitting: Physical Therapy

## 2023-06-23 DIAGNOSIS — M19012 Primary osteoarthritis, left shoulder: Secondary | ICD-10-CM | POA: Diagnosis not present

## 2023-06-23 DIAGNOSIS — M7061 Trochanteric bursitis, right hip: Secondary | ICD-10-CM | POA: Diagnosis not present

## 2023-07-07 ENCOUNTER — Encounter (HOSPITAL_BASED_OUTPATIENT_CLINIC_OR_DEPARTMENT_OTHER): Payer: Self-pay | Admitting: Certified Nurse Midwife

## 2023-07-07 ENCOUNTER — Other Ambulatory Visit (HOSPITAL_COMMUNITY)
Admission: RE | Admit: 2023-07-07 | Discharge: 2023-07-07 | Disposition: A | Discharge status: Home / Self Care | Source: Ambulatory Visit | Attending: Certified Nurse Midwife | Admitting: Certified Nurse Midwife

## 2023-07-07 ENCOUNTER — Ambulatory Visit (HOSPITAL_BASED_OUTPATIENT_CLINIC_OR_DEPARTMENT_OTHER): Admitting: Certified Nurse Midwife

## 2023-07-07 VITALS — BP 118/70 | HR 90 | Ht 65.5 in | Wt 155.0 lb

## 2023-07-07 DIAGNOSIS — N950 Postmenopausal bleeding: Secondary | ICD-10-CM | POA: Diagnosis not present

## 2023-07-07 NOTE — Patient Instructions (Signed)
 ENDOMETRIAL BIOPSY POST-PROCEDURE INSTRUCTIONS  You may take Ibuprofen, Aleve or Tylenol for pain if needed.  Cramping should resolve within in 24 hours.  You may have a small amount of spotting.  You should wear a mini pad for the next few days.  You may have intercourse after 24 hours.  You need to call if you have any pelvic pain, fever, heavy bleeding or foul smelling vaginal discharge.  Shower or bathe as normal  6. We will call you within one week with results or we will discuss   the results at your follow-up appointment if needed.

## 2023-07-07 NOTE — Progress Notes (Signed)
    Subjective:     Kathy Gutierrez is a 68 y.o. female here for postmenopausal bleeding. She thinks she has been menopausal since age 37 (18 years) and has not experienced any vaginal spotting or bleeding during this time. Pt was started on vaginal estrogen in January due to vaginal atrophy. She uses vaginal estrogen twice weekly. Five days ago she started to experience vaginal bleeding "like a light period". Continues to have small amount vaginal bleeding today. Had some uterine cramping.    The following portions of the patient's history were reviewed and updated as appropriate: allergies, current medications, past family history, past medical history, past social history, past surgical history, and problem list.   Review of Systems Pertinent items are noted in HPI.    Objective:    BP 118/70 (BP Location: Right Arm, Patient Position: Sitting, Cuff Size: Large)   Pulse 90   Ht 5' 5.5" (1.664 m)   Wt 155 lb (70.3 kg)   BMI 25.40 kg/m  General appearance: alert and cooperative Pelvic: cervix normal in appearance, external genitalia normal, no cervical motion tenderness, and small/scant amount bright red bleeding noted from cervical os. No evidence of cervical polyp.    Assessment:    Postmenopausal Bleeding    Plan:    Discussed recommendation for Pelvic US  and endometrial biopsy. Consent obtained and endometrial biopsy collected. Pt will RTO for US  to evaluate uterus/ovaries next available.   ENDOMETRIAL BIOPSY     The indications for endometrial biopsy were reviewed.   Risks of the biopsy including cramping, bleeding, infection, uterine perforation, inadequate specimen and need for additional procedures  were discussed. The patient states she understands and agrees to undergo procedure today. Consent was signed. Time out was performed.  A sterile speculum was placed in the patient's vagina and the cervix was prepped with Betadine. A single-toothed tenaculum was placed on the  anterior lip of the cervix to stabilize it. The 3 mm pipelle was introduced into the endometrial cavity without difficulty to a depth of 9 cm, and a moderate amount of tissue was obtained and sent to pathology. The instruments were removed from the patient's vagina. Minimal bleeding from the cervix was noted. The patient tolerated the procedure well. Routine post-procedure instructions were given to the patient.   [name]

## 2023-07-12 LAB — SURGICAL PATHOLOGY

## 2023-07-13 ENCOUNTER — Other Ambulatory Visit (HOSPITAL_BASED_OUTPATIENT_CLINIC_OR_DEPARTMENT_OTHER): Payer: Self-pay | Admitting: Certified Nurse Midwife

## 2023-07-13 ENCOUNTER — Ambulatory Visit (HOSPITAL_BASED_OUTPATIENT_CLINIC_OR_DEPARTMENT_OTHER): Payer: Self-pay | Admitting: Certified Nurse Midwife

## 2023-07-13 DIAGNOSIS — N952 Postmenopausal atrophic vaginitis: Secondary | ICD-10-CM

## 2023-07-13 MED ORDER — ESTRADIOL 0.1 MG/GM VA CREA: TOPICAL_CREAM | VAGINAL | 6 refills | Status: AC

## 2023-07-15 ENCOUNTER — Encounter: Payer: Self-pay | Admitting: Medical-Surgical

## 2023-07-17 MED ORDER — LANCETS 30G MISC
99 refills | Status: AC
Start: 2023-07-17 — End: ?

## 2023-07-17 MED ORDER — ONETOUCH ULTRA 2 W/DEVICE KIT
PACK | 0 refills | Status: AC
Start: 2023-07-17 — End: ?

## 2023-07-17 MED ORDER — ONETOUCH ULTRA TEST VI STRP: ORAL_STRIP | 12 refills | Status: AC

## 2023-07-20 ENCOUNTER — Other Ambulatory Visit (HOSPITAL_BASED_OUTPATIENT_CLINIC_OR_DEPARTMENT_OTHER): Payer: Self-pay | Admitting: *Deleted

## 2023-07-20 DIAGNOSIS — N950 Postmenopausal bleeding: Secondary | ICD-10-CM

## 2023-07-20 NOTE — Progress Notes (Signed)
 Us  order placed per last note - apt 08/09/23 KD

## 2023-08-09 ENCOUNTER — Ambulatory Visit (HOSPITAL_BASED_OUTPATIENT_CLINIC_OR_DEPARTMENT_OTHER): Admitting: Certified Nurse Midwife

## 2023-08-09 ENCOUNTER — Ambulatory Visit (HOSPITAL_BASED_OUTPATIENT_CLINIC_OR_DEPARTMENT_OTHER)

## 2023-08-09 VITALS — BP 121/79 | HR 98 | Ht 65.0 in | Wt 158.0 lb

## 2023-08-09 DIAGNOSIS — N950 Postmenopausal bleeding: Secondary | ICD-10-CM

## 2023-08-09 NOTE — Progress Notes (Signed)
   Subjective:     Kathy Gutierrez is a 68 y.o. female who presented 07/07/23 with c/o vaginal spotting like a light period around 1st week of May. Endometrial biopsy was negative and pt was scheduled to return for Pelvic US  to evaluate ovaries/endometrial lining. Pt denies any further vaginal spotting or bleeding. She is now using Estradiol  vaginally 1gm twice weekly at bedtime.   Review of Systems Pertinent items are noted in HPI.    Objective:    BP 121/79 (BP Location: Right Arm, Patient Position: Sitting, Cuff Size: Normal)   Pulse 98   Ht 5' 5 (1.651 m) Comment: reported  Wt 158 lb (71.7 kg)   BMI 26.29 kg/m  General appearance: alert and cooperative   US  (08/09/23): Pelvic structures were imaged in sagittal and transverse orientation transvaginally. Retroverted uterus appears normal in size without evidence of focal abnormalities EM 2mm Ovaries appear small bilaterally, +perfusion Negative adnexal regions bilaterally Neg cul de sac No free fluid present  CXL wnl  Assessment:    Postmenopausal Bleeding (resolved).    Plan:    US  completed and findings reviewed with patient. No abnormalities noted on US  today. Discussed endometrial biopsy negative. Pt will continue Estradiol  1gm vaginally twice weekly. RTO in 1 year for annual gyn exam and prn if issues arise.  [name]                  US  (08/09/23): Pelvic structures were imaged in sagittal and transverse orientation transvaginally. Retroverted uterus appears normal in size without evidence of focal abnormalities EM 2mm Ovaries appear small bilaterally, +perfusion Negative adnexal regions bilaterally Neg cul de sac No free fluid present  CXL wnl

## 2023-09-27 DIAGNOSIS — M19012 Primary osteoarthritis, left shoulder: Secondary | ICD-10-CM | POA: Diagnosis not present

## 2023-09-27 DIAGNOSIS — M25511 Pain in right shoulder: Secondary | ICD-10-CM | POA: Diagnosis not present

## 2023-09-27 DIAGNOSIS — M19011 Primary osteoarthritis, right shoulder: Secondary | ICD-10-CM | POA: Diagnosis not present

## 2023-10-08 ENCOUNTER — Encounter (HOSPITAL_BASED_OUTPATIENT_CLINIC_OR_DEPARTMENT_OTHER): Payer: Self-pay | Admitting: Certified Nurse Midwife

## 2023-10-09 ENCOUNTER — Ambulatory Visit: Payer: Medicare PPO | Admitting: Medical-Surgical

## 2023-10-09 ENCOUNTER — Encounter: Payer: Self-pay | Admitting: Medical-Surgical

## 2023-10-09 VITALS — BP 115/68 | HR 77 | Resp 20 | Ht 65.0 in | Wt 157.0 lb

## 2023-10-09 DIAGNOSIS — M6289 Other specified disorders of muscle: Secondary | ICD-10-CM

## 2023-10-09 DIAGNOSIS — F419 Anxiety disorder, unspecified: Secondary | ICD-10-CM

## 2023-10-09 DIAGNOSIS — E782 Mixed hyperlipidemia: Secondary | ICD-10-CM | POA: Diagnosis not present

## 2023-10-09 DIAGNOSIS — A6000 Herpesviral infection of urogenital system, unspecified: Secondary | ICD-10-CM

## 2023-10-09 DIAGNOSIS — R7303 Prediabetes: Secondary | ICD-10-CM | POA: Diagnosis not present

## 2023-10-09 DIAGNOSIS — K219 Gastro-esophageal reflux disease without esophagitis: Secondary | ICD-10-CM

## 2023-10-09 DIAGNOSIS — I10 Essential (primary) hypertension: Secondary | ICD-10-CM | POA: Diagnosis not present

## 2023-10-09 DIAGNOSIS — F32A Depression, unspecified: Secondary | ICD-10-CM | POA: Diagnosis not present

## 2023-10-09 DIAGNOSIS — N1831 Chronic kidney disease, stage 3a: Secondary | ICD-10-CM

## 2023-10-09 LAB — POCT GLYCOSYLATED HEMOGLOBIN (HGB A1C)
HbA1c, POC (controlled diabetic range): 6 % (ref 0.0–7.0)
Hemoglobin A1C: 6 % — AB (ref 4.0–5.6)

## 2023-10-09 NOTE — Progress Notes (Signed)
 Established patient visit   History of Present Illness   Discussed the use of AI scribe software for clinical note transcription with the patient, who gave verbal consent to proceed.  History of Present Illness   Kathy Gutierrez is a 68 year old female who presents for follow-up on breakthrough bleeding and medication management.  She experiences breakthrough bleeding, previously evaluated with normal biopsy and ultrasound results. The bleeding, initially linked to estradiol  use, resumed heavily after dose reduction to one gram.  Current medications include olmesartan  with hydrochlorothiazide  for hypertension which has been stable.  Wellbutrin  for mood, well controlled. Lipitor 20mg  daily for hyperlipidemia. Her A1c was 6.0 at the last check, continued prediabetes. She has stage 3 chronic kidney disease, with a recent GFR decrease from 54 to 49 and creatinine increase from 1.13 to 1.22. Meloxicam was used sparingly for arthritis pain but ineffective.  She engages in physical activity at least three days a week, including strength training and cardio, and is cautious with weight limits due to previous shoulder discomfort. She is retired, has a supportive family environment, and enjoys small indulgences like brownies from [employer]. No thoughts of self-harm or harm to others.      Physical Exam   Physical Exam Vitals reviewed.  Constitutional:      General: She is not in acute distress.    Appearance: Normal appearance. She is not ill-appearing.  HENT:     Head: Normocephalic and atraumatic.  Cardiovascular:     Rate and Rhythm: Normal rate and regular rhythm.     Pulses: Normal pulses.     Heart sounds: Normal heart sounds. No murmur heard.    No friction rub. No gallop.  Pulmonary:     Effort: Pulmonary effort is normal. No respiratory distress.     Breath sounds: Normal breath sounds. No wheezing.  Skin:    General: Skin is warm and dry.  Neurological:     Mental Status: She  is alert and oriented to person, place, and time.  Psychiatric:        Mood and Affect: Mood normal.        Behavior: Behavior normal.        Thought Content: Thought content normal.        Judgment: Judgment normal.     Assessment & Plan   Assessment and Plan    Chronic kidney disease stage 3 Chronic kidney disease stage 3 with recent GFR decrease and creatinine increase. BUN stable. - Order labs to monitor kidney function. - Encourage hydration. - Advise avoiding NSAIDs. - Consider nephrology referral if further decline or electrolyte imbalances develop.  Hypertension Hypertension well-controlled on current medication. - Continue olmesartan  with hydrochlorothiazide  20/12.5 mg.  Hyperlipidemia Hyperlipidemia managed with Lipitor. Lipid panel due. - Order lipid panel. - Continue Lipitor 20 mg.  Prediabetes Prediabetes. Emphasized maintaining A1c below 6.5. - Order A1c test, result 6.0% - Continue to avoid concentrated sweets and simple carbohydrates.   Depression Depression well-controlled on Wellbutrin . No self-harm thoughts. - Continue Wellbutrin  300 mg. - Monitor mood and mental health.  Genital herpes on suppressive therapy Genital herpes well-controlled on Valtrex . No recent outbreaks. - Continue Valtrex  500 mg daily.  Abnormal uterine bleeding on estradiol  therapy Abnormal uterine bleeding possibly related to estradiol  therapy. Bleeding recurred after dose reduction. - Continue to follow OBGYN recommendations.      Follow up   Return in about 6 months (around 04/10/2024) for annual physical exam or sooner if needed.  __________________________________ Zada FREDRIK Palin, DNP, APRN, FNP-BC Primary Care and Sports Medicine [facility] [employer]

## 2023-10-10 ENCOUNTER — Ambulatory Visit: Payer: Self-pay | Admitting: Medical-Surgical

## 2023-10-10 ENCOUNTER — Encounter: Payer: Self-pay | Admitting: Medical-Surgical

## 2023-10-10 DIAGNOSIS — N1831 Chronic kidney disease, stage 3a: Secondary | ICD-10-CM

## 2023-10-10 LAB — CMP14+EGFR
ALT: 11 IU/L (ref 0–32)
AST: 15 IU/L (ref 0–40)
Albumin: 4.1 g/dL (ref 3.9–4.9)
Alkaline Phosphatase: 71 IU/L (ref 44–121)
BUN/Creatinine Ratio: 10 — ABNORMAL LOW (ref 12–28)
BUN: 12 mg/dL (ref 8–27)
Bilirubin Total: 0.3 mg/dL (ref 0.0–1.2)
CO2: 21 mmol/L (ref 20–29)
Calcium: 10 mg/dL (ref 8.7–10.3)
Chloride: 102 mmol/L (ref 96–106)
Creatinine, Ser: 1.26 mg/dL — ABNORMAL HIGH (ref 0.57–1.00)
Globulin, Total: 2.5 g/dL (ref 1.5–4.5)
Glucose: 83 mg/dL (ref 70–99)
Potassium: 4 mmol/L (ref 3.5–5.2)
Sodium: 139 mmol/L (ref 134–144)
Total Protein: 6.6 g/dL (ref 6.0–8.5)
eGFR: 47 mL/min/1.73 — ABNORMAL LOW (ref >59)

## 2023-10-10 LAB — LIPID PANEL
Chol/HDL Ratio: 3.3 ratio (ref 0.0–4.4)
Cholesterol, Total: 179 mg/dL (ref 100–199)
HDL: 54 mg/dL (ref >39)
LDL Chol Calc (NIH): 106 mg/dL — ABNORMAL HIGH (ref 0–99)
Triglycerides: 108 mg/dL (ref 0–149)
VLDL Cholesterol Cal: 19 mg/dL (ref 5–40)

## 2023-10-10 MED ORDER — EMPAGLIFLOZIN 10 MG PO TABS: 10.0000 mg | ORAL_TABLET | Freq: Every day | ORAL | 1 refills | Status: AC

## 2023-10-11 ENCOUNTER — Encounter (HOSPITAL_BASED_OUTPATIENT_CLINIC_OR_DEPARTMENT_OTHER): Payer: Self-pay | Admitting: Certified Nurse Midwife

## 2023-10-11 ENCOUNTER — Ambulatory Visit (HOSPITAL_BASED_OUTPATIENT_CLINIC_OR_DEPARTMENT_OTHER): Admitting: Certified Nurse Midwife

## 2023-10-11 VITALS — BP 134/78 | HR 82 | Ht 65.5 in | Wt 157.0 lb

## 2023-10-11 DIAGNOSIS — N950 Postmenopausal bleeding: Secondary | ICD-10-CM

## 2023-10-11 NOTE — Progress Notes (Unsigned)
  Keita started spotting like a light period 4 days ago. This is the 2nd episode of postmenopausal bleeding that has seemed like a period.  She is on Estradiol  1gm vaginally twice weekly.  Spec exam today shows blood at cervical os, appears to be from uterus and not vaginal walls.     Pt had US  and endometrial biopsy (negative) in May/June 2025.   Dr. Anitra you recommend saline-infused US  with you in our office or Hysteroscopy in OR?

## 2023-10-14 ENCOUNTER — Encounter (HOSPITAL_BASED_OUTPATIENT_CLINIC_OR_DEPARTMENT_OTHER): Payer: Self-pay | Admitting: Certified Nurse Midwife

## 2023-10-14 ENCOUNTER — Ambulatory Visit
Admission: EM | Admit: 2023-10-14 | Discharge: 2023-10-14 | Disposition: A | Discharge status: Home / Self Care | Attending: Emergency Medicine | Admitting: Emergency Medicine

## 2023-10-14 ENCOUNTER — Encounter: Payer: Self-pay | Admitting: Emergency Medicine

## 2023-10-14 DIAGNOSIS — R1031 Right lower quadrant pain: Secondary | ICD-10-CM | POA: Diagnosis not present

## 2023-10-14 LAB — SPECIMEN STATUS REPORT

## 2023-10-14 LAB — POCT URINALYSIS DIP (MANUAL ENTRY)
Bilirubin, UA: NEGATIVE
Glucose, UA: NEGATIVE mg/dL
Ketones, POC UA: NEGATIVE mg/dL
Leukocytes, UA: NEGATIVE
Nitrite, UA: NEGATIVE
Protein Ur, POC: NEGATIVE mg/dL
Spec Grav, UA: 1.01 (ref 1.010–1.025)
Urobilinogen, UA: 0.2 U/dL
pH, UA: 6 (ref 5.0–8.0)

## 2023-10-14 NOTE — Discharge Instructions (Signed)
 Overall, your physical exam was reassuring.  You can alternate between 500 mg of Tylenol and 220 mg of Aleve every 4-6 hours.  Warm compresses, gentle stretching and Epsom salt baths may help as well.  It is unclear what is causing your pain exactly, appears to be musculoskeletal.  Do not hesitate to seek follow-up care at the nearest emergency department if you develop fever, nausea, vomiting, severe pain, or new concerning symptoms.  Return to clinic or follow-up with primary care provider if no improvement over the next few days.

## 2023-10-14 NOTE — ED Triage Notes (Signed)
 Patient c/o RLQ pain that started on Tuesday night and the pain subsided.  Last night pain was unbearable, unable to sleep.  Denies any injury, no nausea, vomiting or diarrhea.  Afebrile.  Patient did take an Aleve w/minimal relief.

## 2023-10-14 NOTE — ED Provider Notes (Signed)
 [facility]    CSN: [id number] Arrival date & time: 10/14/23  0800      History   Chief Complaint Chief Complaint  Patient presents with   Abdominal Pain    HPI Kathy Gutierrez is a 68 y.o. female.   Patient presents to clinic over concern of right lower abdominal pain that radiates into her right flank that started earlier this week.  4 nights ago she noticed the pain was there, but then subsided.  Modified mild pain over the past few days.  Last night she was unable to get comfortable, was tossing and turning in the bed.  She was drinking some water and felt urgency and like she was on the toilet a lot to pee.  Does notice that her pain is triggered with movement.  Has not had any fevers, denies nausea, vomiting or diarrhea.  Did feel some urgency of urination last night.  Denies previous abdominal surgeries.  Recently had some postmenopausal bleeding, is in contact with her PCP.  The history is provided by the patient and medical records.  Abdominal Pain   Past Medical History:  Diagnosis Date   Anxiety    GERD (gastroesophageal reflux disease)    High blood pressure    High cholesterol     Patient Active Problem List   Diagnosis Date Noted   Fecal incontinence 11/16/2022   Pelvic floor dysfunction 11/16/2022   Trochanteric bursitis of right hip 01/22/2019   Stage 3a chronic kidney disease (HCC) 12/10/2018   Right hip pain 09/03/2018   Prediabetes 08/08/2018   Essential hypertension 06/15/2017   Gastroesophageal reflux disease 06/15/2017   Mixed hyperlipidemia 06/15/2017   Anxiety and depression 06/15/2017   Genital herpes simplex 06/15/2017   History of adenomatous polyp of colon 12/23/2015   Left shoulder pain 10/16/2014   Irritable bowel syndrome 04/09/2007    History reviewed. No pertinent surgical history.  OB History     Gravida  2   Para  1   Term  1   Preterm      AB  1   Living  1      SAB  1   IAB      Ectopic       Multiple      Live Births  1            Home Medications    Prior to Admission medications   Medication Sig Start Date End Date Taking? Authorizing Provider  atorvastatin  (LIPITOR) 20 MG tablet TAKE 1 TABLET(20 MG) BY MOUTH DAILY 04/12/23  Yes Willo Mini, NP  blood glucose meter kit and supplies KIT Dispense based on patient and insurance preference. Use up to four times daily as directed. Please include lancets, test strips, control solution. 03/18/22  Yes Willo Mini, NP  Blood Glucose Monitoring Suppl (ONE TOUCH ULTRA 2) w/Device KIT Use to check glucose up to 4 times daily 07/17/23  Yes Jessup, Mini, NP  buPROPion  (WELLBUTRIN  XL) 300 MG 24 hr tablet TAKE 1 TABLET BY MOUTH EVERY MORNING 04/12/23  Yes Willo Mini, NP  estradiol  (ESTRACE ) 0.1 MG/GM vaginal cream 1 gram vaginally at bedtime twice weekly 07/13/23  Yes Lo, Arland POUR, CNM  glucose blood (ONETOUCH ULTRA TEST) test strip Use to check glucose up to 4 times daily 07/17/23  Yes Willo Mini, NP  Lancets 30G MISC Use to check glucose up to 4 times daily 07/17/23  Yes Willo Mini, NP  metFORMIN  (GLUCOPHAGE  XR) 500 MG 24  hr tablet Take 1 tablet (500 mg total) by mouth daily with breakfast. 04/12/23  Yes Willo Mini, NP  olmesartan -hydrochlorothiazide  (BENICAR  HCT) 20-12.5 MG tablet Take 1 tablet by mouth daily. 04/12/23  Yes Willo Mini, NP  pantoprazole  (PROTONIX ) 40 MG tablet Take 1 tablet (40 mg total) by mouth daily. 04/12/23  Yes Willo Mini, NP  valACYclovir  (VALTREX ) 500 MG tablet Take 1 tablet (500 mg total) by mouth daily. 04/12/23  Yes Willo Mini, NP  empagliflozin  (JARDIANCE ) 10 MG TABS tablet Take 1 tablet (10 mg total) by mouth daily before breakfast. 10/10/23   Willo Mini, NP    Family History Family History  Problem Relation Age of Onset   Diabetes Mother    High Cholesterol Mother    High blood pressure Mother    Aortic aneurysm Mother    High blood pressure Father    Anuerysm Sister    Multiple sclerosis Sister      Social History Social History   Tobacco Use   Smoking status: Never   Smokeless tobacco: Never  Vaping Use   Vaping status: Never Used  Substance Use Topics   Alcohol use: Not Currently    Comment: Maybe a glassnof wine 2 ntimes a month   Drug use: Never     Allergies   Patient has no known allergies.   Review of Systems Review of Systems  Per HPI  Physical Exam Triage Vital Signs ED Triage Vitals  Encounter Vitals Group     BP 10/14/23 0813 (!) 147/83     Girls Systolic BP Percentile --      Girls Diastolic BP Percentile --      Boys Systolic BP Percentile --      Boys Diastolic BP Percentile --      Pulse Rate 10/14/23 0813 87     Resp 10/14/23 0813 18     Temp 10/14/23 0813 99.4 F (37.4 C)     Temp Source 10/14/23 0813 Oral     SpO2 10/14/23 0813 100 %     Weight 10/14/23 0812 157 lb (71.2 kg)     Height 10/14/23 0812 5' 5 (1.651 m)     Head Circumference --      Peak Flow --      Pain Score 10/14/23 0811 6     Pain Loc --      Pain Education --      Exclude from Growth Chart --    No data found.  Updated Vital Signs BP (!) 147/83 (BP Location: Right Arm)   Pulse 87   Temp 99.4 F (37.4 C) (Oral)   Resp 18   Ht 5' 5 (1.651 m)   Wt 157 lb (71.2 kg)   SpO2 100%   BMI 26.13 kg/m   Visual Acuity Right Eye Distance:   Left Eye Distance:   Bilateral Distance:    Right Eye Near:   Left Eye Near:    Bilateral Near:     Physical Exam Vitals and nursing note reviewed.  Constitutional:      Appearance: She is well-developed.  HENT:     Head: Normocephalic and atraumatic.  Eyes:     General: No scleral icterus. Cardiovascular:     Rate and Rhythm: Normal rate.  Pulmonary:     Effort: Respiratory distress present.  Abdominal:     General: Abdomen is flat. Bowel sounds are normal.     Palpations: Abdomen is soft.     Tenderness: There is no  abdominal tenderness. There is no guarding or rebound. Negative signs include Murphy's sign  and Rovsing's sign.  Skin:    General: Skin is warm and dry.     Findings: No rash.  Neurological:     General: No focal deficit present.     Mental Status: She is alert and oriented to person, place, and time.  Psychiatric:        Mood and Affect: Mood normal.        Behavior: Behavior normal.      UC Treatments / Results  Labs (all labs ordered are listed, but only abnormal results are displayed) Labs Reviewed  POCT URINALYSIS DIP (MANUAL ENTRY) - Abnormal; Notable for the following components:      Result Value   Blood, UA small (*)    All other components within normal limits    EKG   Radiology No results found.  Procedures Procedures (including critical care time)  Medications Ordered in UC Medications - No data to display  Initial Impression / Assessment and Plan / UC Course  I have reviewed the triage vital signs and the nursing notes.  Pertinent labs & imaging results that were available during my care of the patient were reviewed by me and considered in my medical decision making (see chart for details).  Vitals and triage reviewed, patient is hemodynamically stable.  Abdomen is soft and nontender with active bowel sounds.  Without rash to the overlying area.  Area is nontender to palpation.  UA did have some blood, could be due to recent postmenopausal bleeding.  UA did not show evidence of infection.  Overall patient presentation is reassuring, low concern for acute abdomen.  Symptomatic management for pain discussed, appropriate follow-up discussed as well.  Patient verbalized understanding, no questions at this time.     Final Clinical Impressions(s) / UC Diagnoses   Final diagnoses:  Right lower quadrant abdominal pain     Discharge Instructions      Overall, your physical exam was reassuring.  You can alternate between 500 mg of Tylenol and 220 mg of Aleve every 4-6 hours.  Warm compresses, gentle stretching and Epsom salt baths may help as  well.  It is unclear what is causing your pain exactly, appears to be musculoskeletal.  Do not hesitate to seek follow-up care at the nearest emergency department if you develop fever, nausea, vomiting, severe pain, or new concerning symptoms.  Return to clinic or follow-up with primary care provider if no improvement over the next few days.     ED Prescriptions   None    PDMP not reviewed this encounter.   Dreama Rebbecca SAILOR, FNP 10/14/23 [phone]

## 2023-10-16 ENCOUNTER — Ambulatory Visit: Payer: Self-pay | Admitting: Medical-Surgical

## 2023-10-16 DIAGNOSIS — N950 Postmenopausal bleeding: Secondary | ICD-10-CM | POA: Insufficient documentation

## 2023-10-20 ENCOUNTER — Ambulatory Visit (HOSPITAL_BASED_OUTPATIENT_CLINIC_OR_DEPARTMENT_OTHER): Admitting: Obstetrics & Gynecology

## 2023-10-20 ENCOUNTER — Encounter (HOSPITAL_BASED_OUTPATIENT_CLINIC_OR_DEPARTMENT_OTHER): Payer: Self-pay | Admitting: Obstetrics & Gynecology

## 2023-10-20 VITALS — BP 144/87 | HR 99 | Ht 65.0 in | Wt 158.0 lb

## 2023-10-20 DIAGNOSIS — N950 Postmenopausal bleeding: Secondary | ICD-10-CM

## 2023-10-20 DIAGNOSIS — I129 Hypertensive chronic kidney disease with stage 1 through stage 4 chronic kidney disease, or unspecified chronic kidney disease: Secondary | ICD-10-CM

## 2023-10-20 DIAGNOSIS — R7303 Prediabetes: Secondary | ICD-10-CM | POA: Diagnosis not present

## 2023-10-20 DIAGNOSIS — N1831 Chronic kidney disease, stage 3a: Secondary | ICD-10-CM | POA: Diagnosis not present

## 2023-10-20 DIAGNOSIS — I10 Essential (primary) hypertension: Secondary | ICD-10-CM

## 2023-10-20 NOTE — Progress Notes (Signed)
 GYNECOLOGY  VISIT  CC:   PMP bleeding  HPI: 68 y.o. G90P1011 Divorced Black or [place] American female here for h/o recent PMP bleeding.  Was bright red.  There was some cramping with it.  Lasted from Sat to Wed.  This is the second episode of bleeding like this.  Ultrasound was done 08/09/2023 and uterus measured 5.2 x 4.5 x 3.4cm.  Endometrium was 2.19mm.  Ovaries normal.  Cervix normal.  Endometrial biopsy was 07/07/2023 and was negative for abnormal cells.  Pap was 07/07/2023 and was ASCUS with neg HR HPV.  She's had a complete work-up at this point.  She feels the first episode of bleeding was due to the amount of vaginal estrogen she was receiving.  She had other associated symptoms as well.  She decreased the estrogen back in May with the first episode of bleeding.  She decided last week to stop the estrogen completely.    We discussed the evaluation for postmenopausal bleeding.  If this occurs again, the next step would be a hysteroscopy for visualization as possibly there is a small polyp.  Procedure reviewed.  Outpatient nature of procedure discussed.  Risks and benefits reviewed including uterine perforation, electrolyte issues, cardiac arrhythmia, cerebral edema.  She is not on any aspirin or blood thinner at this time.  She is comfortable with this plan and will monitor and let me know if bleeding occurs again.     Past Medical History:  Diagnosis Date   Anxiety    GERD (gastroesophageal reflux disease)    High blood pressure    High cholesterol     MEDS:   Current Outpatient Medications on File Prior to Visit  Medication Sig Dispense Refill   atorvastatin  (LIPITOR) 20 MG tablet TAKE 1 TABLET(20 MG) BY MOUTH DAILY 90 tablet 3   blood glucose meter kit and supplies KIT Dispense based on patient and insurance preference. Use up to four times daily as directed. Please include lancets, test strips, control solution. 1 each 0   Blood Glucose Monitoring Suppl (ONE TOUCH ULTRA 2) w/Device  KIT Use to check glucose up to 4 times daily 1 kit 0   buPROPion  (WELLBUTRIN  XL) 300 MG 24 hr tablet TAKE 1 TABLET BY MOUTH EVERY MORNING 90 tablet 3   estradiol  (ESTRACE ) 0.1 MG/GM vaginal cream 1 gram vaginally at bedtime twice weekly 42.5 g 6   glucose blood (ONETOUCH ULTRA TEST) test strip Use to check glucose up to 4 times daily 100 each 12   Lancets 30G MISC Use to check glucose up to 4 times daily 100 each PRN   metFORMIN  (GLUCOPHAGE  XR) 500 MG 24 hr tablet Take 1 tablet (500 mg total) by mouth daily with breakfast. 90 tablet 3   olmesartan -hydrochlorothiazide  (BENICAR  HCT) 20-12.5 MG tablet Take 1 tablet by mouth daily. 90 tablet 3   pantoprazole  (PROTONIX ) 40 MG tablet Take 1 tablet (40 mg total) by mouth daily. 90 tablet 3   valACYclovir  (VALTREX ) 500 MG tablet Take 1 tablet (500 mg total) by mouth daily. 90 tablet 3   empagliflozin  (JARDIANCE ) 10 MG TABS tablet Take 1 tablet (10 mg total) by mouth daily before breakfast. (Patient not taking: Reported on 10/20/2023) 90 tablet 1   No current facility-administered medications on file prior to visit.    ALLERGIES: Patient has no known allergies.  SH:  divorced, non smoker  Review of Systems  Constitutional: Negative.   Genitourinary:        PMP bleeding  PHYSICAL EXAMINATION:    BP (!) 144/87   Pulse 99   Ht 5' 5 (1.651 m)   Wt 158 lb (71.7 kg)   SpO2 100%   BMI 26.29 kg/m     General appearance: alert, cooperative and appears stated age  Abdomen: soft, non-tender; bowel sounds normal; no masses,  no organomegaly Lymph:  no inguinal LAD noted Pelvic: External genitalia:  no lesions              Urethra:  normal appearing urethra with no masses, tenderness or lesions              Bartholins and Skenes: normal                 Vagina: normal mucosa without prolapse or lesions              Cervix: no lesions              Bimanual Exam:  Uterus:  normal size, contour, position, consistency, mobility, non-tender               Adnexa: no mass, fullness, tenderness  Chaperone was present for exam.  Assessment/Plan: 1. Postmenopausal bleeding (Primary) - pt has undergone ultrasound, endometrial biopsy, pap smear.  All were normal/negative.  Endometrium was 2.34mm on ultrasound. - she has been using vaginal estrogen cream and has already decided to stop.   - if has bleeding again, will proceed with scheduling hysteroscopy with possible D&C.  Procedure, risks/benefits discussed today.  2. Prediabetes - most recent hba1c was 10/09/2023 and was 6.0 - on Jiardiance and metformin   3. Essential hypertension - on olmestartan/hctz  4. Stage 3a chronic kidney disease (HCC) - most recent creatinine was 09/29/2023 and was 1.26  T

## 2023-10-25 DIAGNOSIS — R1031 Right lower quadrant pain: Secondary | ICD-10-CM | POA: Diagnosis not present

## 2023-10-25 DIAGNOSIS — M6289 Other specified disorders of muscle: Secondary | ICD-10-CM | POA: Diagnosis not present

## 2023-11-01 DIAGNOSIS — R1031 Right lower quadrant pain: Secondary | ICD-10-CM | POA: Diagnosis not present

## 2023-12-06 NOTE — Progress Notes (Signed)
 Albie Bazin                                          MRN: [id number]   12/06/2023   The [facility] Quality Team Specialist reviewed this patient medical record for the purposes of chart review for care gap closure. The following were reviewed: chart review for care gap closure-kidney health evaluation for diabetes:eGFR  and uACR.    [facility] Quality Team

## 2023-12-20 ENCOUNTER — Encounter: Payer: Self-pay | Admitting: Medical-Surgical

## 2024-01-08 DIAGNOSIS — R92333 Mammographic heterogeneous density, bilateral breasts: Secondary | ICD-10-CM | POA: Diagnosis not present

## 2024-01-08 DIAGNOSIS — Z1231 Encounter for screening mammogram for malignant neoplasm of breast: Secondary | ICD-10-CM | POA: Diagnosis not present

## 2024-01-08 LAB — HM MAMMOGRAPHY

## 2024-01-11 ENCOUNTER — Encounter: Payer: Self-pay | Admitting: Medical-Surgical

## 2024-01-11 ENCOUNTER — Encounter (HOSPITAL_BASED_OUTPATIENT_CLINIC_OR_DEPARTMENT_OTHER): Payer: Self-pay

## 2024-01-11 ENCOUNTER — Other Ambulatory Visit: Payer: Self-pay | Admitting: Medical-Surgical

## 2024-01-11 DIAGNOSIS — E782 Mixed hyperlipidemia: Secondary | ICD-10-CM

## 2024-01-16 ENCOUNTER — Encounter: Payer: Self-pay | Admitting: Medical-Surgical

## 2024-01-31 ENCOUNTER — Encounter: Payer: Self-pay | Admitting: Medical-Surgical

## 2024-03-13 LAB — MICROALBUMIN / CREATININE URINE RATIO: Microalb Creat Ratio: 3

## 2024-03-14 ENCOUNTER — Encounter (HOSPITAL_BASED_OUTPATIENT_CLINIC_OR_DEPARTMENT_OTHER): Payer: Self-pay | Admitting: Obstetrics & Gynecology

## 2024-03-15 ENCOUNTER — Ambulatory Visit (HOSPITAL_BASED_OUTPATIENT_CLINIC_OR_DEPARTMENT_OTHER): Admitting: Certified Nurse Midwife

## 2024-03-19 ENCOUNTER — Ambulatory Visit (HOSPITAL_BASED_OUTPATIENT_CLINIC_OR_DEPARTMENT_OTHER): Admitting: Obstetrics and Gynecology

## 2024-03-26 ENCOUNTER — Other Ambulatory Visit: Payer: Self-pay

## 2024-03-26 MED ORDER — PANTOPRAZOLE SODIUM 40 MG PO TBEC: 40.0000 mg | DELAYED_RELEASE_TABLET | Freq: Every day | ORAL | 1 refills | Status: AC

## 2024-04-12 ENCOUNTER — Encounter: Admitting: Medical-Surgical

## 2024-04-15 ENCOUNTER — Ambulatory Visit (HOSPITAL_BASED_OUTPATIENT_CLINIC_OR_DEPARTMENT_OTHER): Payer: Self-pay | Admitting: Obstetrics and Gynecology
